# Patient Record
Sex: Male | Born: 1985 | Race: White | Hispanic: No | Marital: Married | Smoking: Former smoker
Health system: Southern US, Community
[De-identification: ages and names within clinical notes are randomized; demographics above are authoritative.]

## PROBLEM LIST (undated history)

## (undated) DIAGNOSIS — Z789 Other specified health status: Secondary | ICD-10-CM

## (undated) DIAGNOSIS — K219 Gastro-esophageal reflux disease without esophagitis: Secondary | ICD-10-CM

## (undated) HISTORY — PX: NO PAST SURGERIES: SHX2092

## (undated) HISTORY — DX: Other specified health status: Z78.9

## (undated) HISTORY — DX: Gastro-esophageal reflux disease without esophagitis: K21.9

---
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
## 2004-07-27 HISTORY — PX: WISDOM TOOTH EXTRACTION: SHX21

## 2019-01-07 ENCOUNTER — Encounter (HOSPITAL_BASED_OUTPATIENT_CLINIC_OR_DEPARTMENT_OTHER): Payer: Self-pay | Admitting: Emergency Medicine

## 2019-01-07 ENCOUNTER — Emergency Department (HOSPITAL_BASED_OUTPATIENT_CLINIC_OR_DEPARTMENT_OTHER): Payer: BC Managed Care – PPO

## 2019-01-07 ENCOUNTER — Emergency Department (HOSPITAL_BASED_OUTPATIENT_CLINIC_OR_DEPARTMENT_OTHER)
Admission: EM | Admit: 2019-01-07 | Discharge: 2019-01-07 | Disposition: A | Discharge status: Home / Self Care | Payer: BC Managed Care – PPO | Attending: Emergency Medicine | Admitting: Emergency Medicine

## 2019-01-07 ENCOUNTER — Other Ambulatory Visit: Payer: Self-pay

## 2019-01-07 DIAGNOSIS — R079 Chest pain, unspecified: Secondary | ICD-10-CM | POA: Diagnosis not present

## 2019-01-07 DIAGNOSIS — Z87891 Personal history of nicotine dependence: Secondary | ICD-10-CM | POA: Diagnosis not present

## 2019-01-07 DIAGNOSIS — R0789 Other chest pain: Secondary | ICD-10-CM | POA: Insufficient documentation

## 2019-01-07 LAB — CBC WITH DIFFERENTIAL/PLATELET
Abs Immature Granulocytes: 0.03 10*3/uL (ref 0.00–0.07)
Basophils Absolute: 0.1 10*3/uL (ref 0.0–0.1)
Basophils Relative: 1 %
Eosinophils Absolute: 0.2 10*3/uL (ref 0.0–0.5)
Eosinophils Relative: 2 %
HCT: 42.2 % (ref 39.0–52.0)
Hemoglobin: 14.6 g/dL (ref 13.0–17.0)
Immature Granulocytes: 0 %
Lymphocytes Relative: 32 %
Lymphs Abs: 3 10*3/uL (ref 0.7–4.0)
MCH: 31.9 pg (ref 26.0–34.0)
MCHC: 34.6 g/dL (ref 30.0–36.0)
MCV: 92.1 fL (ref 80.0–100.0)
Monocytes Absolute: 0.6 10*3/uL (ref 0.1–1.0)
Monocytes Relative: 6 %
Neutro Abs: 5.5 10*3/uL (ref 1.7–7.7)
Neutrophils Relative %: 59 %
Platelets: 281 10*3/uL (ref 150–400)
RBC: 4.58 MIL/uL (ref 4.22–5.81)
RDW: 12.9 % (ref 11.5–15.5)
WBC: 9.4 10*3/uL (ref 4.0–10.5)
nRBC: 0 % (ref 0.0–0.2)

## 2019-01-07 LAB — TROPONIN I
Troponin I: <0.03 ng/mL (ref <0.03)
Troponin I: <0.03 ng/mL (ref <0.03)

## 2019-01-07 LAB — BASIC METABOLIC PANEL
Anion gap: 8 (ref 5–15)
BUN: 8 mg/dL (ref 6–20)
CO2: 25 mmol/L (ref 22–32)
Calcium: 9.3 mg/dL (ref 8.9–10.3)
Chloride: 106 mmol/L (ref 98–111)
Creatinine, Ser: 0.89 mg/dL (ref 0.61–1.24)
GFR calc Af Amer: >60 mL/min (ref >60)
GFR calc non Af Amer: >60 mL/min (ref >60)
Glucose, Bld: 113 mg/dL — ABNORMAL HIGH (ref 70–99)
Potassium: 3.9 mmol/L (ref 3.5–5.1)
Sodium: 139 mmol/L (ref 135–145)

## 2019-01-07 LAB — D-DIMER, QUANTITATIVE: D-Dimer, Quant: <0.27 ug/mL-FEU (ref 0.00–0.50)

## 2019-01-07 NOTE — ED Triage Notes (Signed)
First episode of left sided CP was over 1 week ago while running. Lasted about 10 min.  Has been having a few episodes of sharp CP a day since then lasting about 1 min each.  Today it was a little worse and had pain down left arm. Denies pain at this time.

## 2019-01-07 NOTE — Discharge Instructions (Addendum)
As we discussed today, your work-up was very reassuring.  Please follow-up with referred [facility] for further evaluation.  Return the emergency department for any worsening chest pain, difficulty breathing, pain becomes more severe or frequent, fevers or any other worsening or concerning symptoms.

## 2019-01-07 NOTE — ED Provider Notes (Signed)
[facility] EMERGENCY DEPARTMENT Provider Note   CSN: [id number] Arrival date & time: 01/07/19  1252    History   Chief Complaint Chief Complaint  Patient presents with   Chest Pain    HPI Jared Simpson is a 33 y.o. male with no significant past medical history who presents for evaluation of intermittent chest pain that began approximately 1 week ago.  He first noticed the chest pain about a week ago while he was running.  He states that it was a sharp chest pain on the left side.  He states that he was already sweaty from running so he did not know if he had any associated diaphoresis but did not have any associated nausea.  He did note some mild shortness of breath but again did not know if that was because he was running.  He states that he stopped running and walked back home by the time he had walked back home, the chest pain had resolved.  He states that over the course of last week, he has had intermittent episodes of sharp chest pain.  These episodes are not associated with exertion and states that he has them both at rest and while doing work around his house.  He states that he has had it while sitting on the couch watching TV as well as well being at work.  He states that they will last about a minute or so and resolve on their own.  He states that he is not taking anything for pain.  He denies any associated nausea/vomiting/diaphoresis or shortness of breath with the pain.  He denies any pain with deep inspiration.  Patient states that today while at work at about 9 AM, he had chest pain again.  He states that this was sharp in nature on the left side and radiating to the left upper extremity.  Patient states that this lasted for about an hour which is the longest that he had ever lasted so he was concerned and came to the emergency department.  Arrival, he states his pain has resolved.  Patient denies any preceding trauma, injury, new workouts.  He states that there are no  alleviating or aggravating factors but that chest pain will just resolve on its own.  Reports he was a former smoker for about 10 years and quit about 6 months ago.  He denies any cocaine or heroin use.  He denies any personal cardiac history or family history of MIs before the age of 34.  He states that he thinks his grandmother may have some heart disease.  He denies any personal history of hypertension or diabetes. He denies any exogenous hormone use, recent immobilization, prior history of DVT/PE, recent surgery, leg swelling, or long travel.  HPI: A 33 year old patient presents for evaluation of chest pain. Initial onset of pain was approximately 1-3 hours ago. The patient's chest pain is sharp and is not worse with exertion. The patient's chest pain is middle- or left-sided, is not well-localized, is not described as heaviness/pressure/tightness and does radiate to the arms/jaw/neck. The patient does not complain of nausea and denies diaphoresis. The patient has no history of stroke, has no history of peripheral artery disease, has not smoked in the past 90 days, denies any history of treated diabetes, has no relevant family history of coronary artery disease (first degree relative at less than age 34), is not hypertensive, has no history of hypercholesterolemia and does not have an elevated BMI (>=30).   .Marland Kitchen  The history is provided by the patient.    History reviewed. No pertinent past medical history.  There are no active problems to display for this patient.   History reviewed. No pertinent surgical history.      Home Medications    Prior to Admission medications   Not on File    Family History No family history on file.  Social History Social History   Tobacco Use   Smoking status: Former Smoker   Smokeless tobacco: Never Used  Substance Use Topics   Alcohol use: Yes    Comment: 3-4 days/week   Drug use: Yes    Types: Marijuana    Comment: last used yesterday      Allergies   Penicillins   Review of Systems Review of Systems  Constitutional: Negative for fever.  Respiratory: Negative for cough and shortness of breath.   Cardiovascular: Positive for chest pain. Negative for leg swelling.  Gastrointestinal: Negative for abdominal pain, nausea and vomiting.  Genitourinary: Negative for dysuria and hematuria.  Neurological: Negative for headaches.  All other systems reviewed and are negative.    Physical Exam Updated Vital Signs BP 127/89 (BP Location: Right Arm)    Pulse 70    Temp 99.4 F (37.4 C) (Oral)    Resp 18    Ht 5\' 10"  (1.778 m)    Wt 90.7 kg    SpO2 100%    BMI 28.70 kg/m   Physical Exam Vitals signs and nursing note reviewed.  Constitutional:      Appearance: Normal appearance. He is well-developed.  HENT:     Head: Normocephalic and atraumatic.  Eyes:     General: Lids are normal.     Conjunctiva/sclera: Conjunctivae normal.     Pupils: Pupils are equal, round, and reactive to light.  Neck:     Musculoskeletal: Full passive range of motion without pain.  Cardiovascular:     Rate and Rhythm: Normal rate and regular rhythm.     Pulses: Normal pulses.          Radial pulses are 2+ on the right side and 2+ on the left side.       Dorsalis pedis pulses are 2+ on the right side and 2+ on the left side.     Heart sounds: Normal heart sounds. No murmur. No friction rub. No gallop.   Pulmonary:     Effort: Pulmonary effort is normal.     Breath sounds: Normal breath sounds.     Comments: Lungs clear to auscultation bilaterally.  Symmetric chest rise.  No wheezing, rales, rhonchi. Abdominal:     Palpations: Abdomen is soft. Abdomen is not rigid.     Tenderness: There is no abdominal tenderness. There is no guarding.  Musculoskeletal: Normal range of motion.     Comments: BLE are symmetric in appearance without any overlying warmth, erythema, edema.   Skin:    General: Skin is warm and dry.     Capillary Refill:  Capillary refill takes less than 2 seconds.  Neurological:     Mental Status: He is alert and oriented to person, place, and time.  Psychiatric:        Speech: Speech normal.      ED Treatments / Results  Labs (all labs ordered are listed, but only abnormal results are displayed) Labs Reviewed  BASIC METABOLIC PANEL - Abnormal; Notable for the following components:      Result Value   Glucose, Bld 113 (*)  All other components within normal limits  CBC WITH DIFFERENTIAL/PLATELET  TROPONIN I  D-DIMER, QUANTITATIVE (NOT [facility])  TROPONIN I    EKG EKG Interpretation  Date/Time:  Saturday January 07 2019 13:02:18 EDT Ventricular Rate:  104 PR Interval:    QRS Duration: 109 QT Interval:  350 QTC Calculation: 461 R Axis:   -14 Text Interpretation:  Sinus tachycardia Borderline prolonged PR interval No old tracing to compare Confirmed by Rolan BuccoBelfi, Melanie [phone]([id number]) on 01/07/2019 1:29:07 PM   Radiology Dg Chest 2 View  Result Date: 01/07/2019 CLINICAL DATA:  Left chest pain for 1 week. EXAM: CHEST - 2 VIEW COMPARISON:  None. FINDINGS: The heart size and mediastinal contours are within normal limits. Both lungs are clear. The visualized skeletal structures are unremarkable. IMPRESSION: No active cardiopulmonary disease. Electronically Signed   By: Gaylyn RongWalter  Liebkemann M.D.   On: 01/07/2019 13:35    Procedures Procedures (including critical care time)  Medications Ordered in ED Medications - No data to display   Initial Impression / Assessment and Plan / ED Course  I have reviewed the triage vital signs and the nursing notes.  Pertinent labs & imaging results that were available during my care of the patient were reviewed by me and considered in my medical decision making (see chart for details).     HEAR Score: 761  33 year old male with no significant past medical history who presents for evaluation of 1 week of intermittent chest pain.  Reports it is occurred randomly both at  rest and while doing things.  Resolves after about a minute or so.  Today was at work and had pain that lasted over about an hour and radiated to his left arm.  On ED arrival, pain has an on ED arrival, he is afebrile.  He is slightly tachycardic and hypertensive.  Likely secondary to anxiety.  Low suspicion for angina, ACS etiology but also consideration.  History/physical exam not concerning for pericarditis, aortic dissection.  Consider skill skeletal versus infectious etiology.  Low suspicion for PE but given patient's tachycardia not PERC out patient.  Plan for labs, chest x-ray.  CBC with no leukocytosis or anemia. BMP is unremarkable. CXR negative for any infectious etiology. Trop is negative and D-dimer is negative.   Given patient's history/risk factors, he has a heart score 1.  Given that his pain most recently occurred at 9 AM this morning, will plan for delta troponin.  Repeat troponin negative.  Discussed results with patient.  Vitals stable.  At this time, given reassuring heart score and reassuring work-up, no admission warranted.  I discussed with patient to follow-up with primary care doctor for further evaluation of his pain. At this time, patient exhibits no emergent life-threatening condition that require further evaluation in ED or admission. Patient had ample opportunity for questions and discussion. All patient's questions were answered with full understanding. Strict return precautions discussed. Patient expresses understanding and agreement to plan.   Portions of this note were generated with Scientist, clinical (histocompatibility and immunogenetics)Dragon dictation software. Dictation errors may occur despite best attempts at proofreading.  Final Clinical Impressions(s) / ED Diagnoses   Final diagnoses:  Nonspecific chest pain    ED Discharge Orders    None       Rosana HoesLayden, Santiago Graf A, PA-C 01/07/19 1952    Rolan BuccoBelfi, Melanie, MD 01/08/19 [phone]

## 2019-01-13 ENCOUNTER — Ambulatory Visit: Payer: BC Managed Care – PPO | Admitting: Family Medicine

## 2019-01-13 ENCOUNTER — Encounter: Payer: Self-pay | Admitting: Family Medicine

## 2019-01-13 ENCOUNTER — Other Ambulatory Visit: Payer: Self-pay

## 2019-01-13 VITALS — BP 128/88 | HR 98 | Temp 98.3°F | Ht 70.0 in | Wt 202.0 lb

## 2019-01-13 DIAGNOSIS — R0789 Other chest pain: Secondary | ICD-10-CM

## 2019-01-13 NOTE — Progress Notes (Signed)
Chief Complaint  Patient presents with  . New Patient (Initial Visit)    Chest pain        New Patient Visit SUBJECTIVE: HPI: Jared Simpson is an 33 y.o.male who is being seen for establishing care.  The patient has not had PCP in quite some time.   Duration of issue: 2 weeks Quality: sharp Palliation: pressing really hard Provocation: none Severity: 4/10 Radiation: L shoulder/upper arm Duration of chest pain: 5 minutes Associated symptoms: L arm pain Cardiac history: none Family heart history: none Smoker? No; former smoker 5 pack yr history Has not tried anything at home. +Hx of GERD, does not feel the same.   Allergies  Allergen Reactions  . Penicillins     Was told he is allergic but does not know what it does.    Past Medical History:  Diagnosis Date  . No known health problems    Past Surgical History:  Procedure Laterality Date  . NO PAST SURGERIES     Family History  Problem Relation Age of Onset  . Non-Hodgkin's lymphoma Brother   . Cancer Maternal Grandfather        skin cancer  . Heart disease Paternal Grandmother   . Diabetes Paternal Grandfather    Allergies  Allergen Reactions  . Penicillins     Was told he is allergic but does not know what it does.   Takes no meds routinely.   ROS Cardiovascular: Denies current chest pain  Respiratory: Denies dyspnea   OBJECTIVE: BP 128/88 (BP Location: Left Arm, Patient Position: Sitting, Cuff Size: Large)   Pulse 98   Temp 98.3 F (36.8 C) (Oral)   Ht 5\' 10"  (1.778 m)   Wt 202 lb (91.6 kg)   SpO2 97%   BMI 28.98 kg/m   Constitutional: -  VS reviewed -  Well developed, well nourished, appears stated age -  No apparent distress  Psychiatric: -  Oriented to person, place, and time -  Memory intact -  Affect and mood normal -  Fluent conversation, good eye contact -  Judgment and insight age appropriate  Eye: -  Conjunctivae clear, no discharge -  Pupils symmetric, round, reactive to light   ENMT: -  MMM    Pharynx moist, no exudate, no erythema  Neck: -  No gross swelling, no palpable masses -  Thyroid midline, not enlarged, mobile, no palpable masses  Cardiovascular: -  RRR -  No LE edema  Respiratory: -  Normal respiratory effort, no accessory muscle use, no retraction -  Breath sounds equal, no wheezes, no ronchi, no crackles  Gastrointestinal: -  Bowel sounds normal -  No tenderness, no distention, no guarding, no masses  Musculoskeletal: -   CP not reproducible to palpation -  Gait normal  Skin: -  No significant lesion on inspection -  Warm and dry to palpation   ASSESSMENT/PLAN: Atypical chest pain - Plan: EKG from ED reviewed. Very few risk factors. Offered cards referral, standing offer. Stretches/exercises for pecs given. Brought up stress could also be a cause.   Patient should return in 3 mo for CPE or prn. The patient voiced understanding and agreement to the plan.   [place], DO 01/13/19  2:28 PM

## 2019-01-13 NOTE — Patient Instructions (Addendum)
Heat (pad or rice pillow in microwave) over affected area, 10-15 minutes twice daily.   Ice/cold pack over area for 10-15 min twice daily.  Let me know if you would ever like to see a cardiologist regarding your chest pain.  Pectoralis Major Rehab Ask your health care provider which exercises are safe for you. Do exercises exactly as told by your health care provider and adjust them as directed. It is normal to feel mild stretching, pulling, tightness, or discomfort as you do these exercises, but you should stop right away if you feel sudden pain or your pain gets worse.Do not begin these exercises until told by your health care provider. Stretching and range of motion exercises These exercises warm up your muscles and joints and improve the movement and flexibility of your shoulder. These exercises can also help to relieve pain, numbness, and tingling. Exercise A: Pendulum  1. Stand near a wall or a surface that you can hold onto for balance. 2. Bend at the waist and let your left / right arm hang straight down. Use your other arm to keep your balance. 3. Relax your arm and shoulder muscles, and move your hips and your trunk so your left / right arm swings freely. Your arm should swing because of the motion of your body, not because you are using your arm or shoulder muscles. 4. Keep moving so your arm swings in the following directions, as told by your health care provider: ? Side to side. ? Forward and backward. ? In clockwise and counterclockwise circles. 5. Slowly return to the starting position. Repeat 2 times. Complete this exercise 3 times per week. Exercise B: Abduction, standing 1. Stand and hold a broomstick, a cane, or a similar object. Place your hands a little more than shoulder-width apart on the object. Your left / right hand should be palm-up, and your other hand should be palm-down. 2. While keeping your elbow straight and your shoulder muscles relaxed, push the stick across  your body toward your left / right side. Raise your left / right arm to the side of your body and then over your head until you feel a stretch in your shoulder. ? Stop when you reach the angle that is recommended by your health care provider. ? Avoid shrugging your shoulder while you raise your arm. Keep your shoulder blade tucked down toward the middle of your spine. 3. Hold for 10 seconds. 4. Slowly return to the starting position. Repeat 2 times. Complete this exercise 3 times per week. Exercise C: Wand flexion, supine  1. Lie on your back. You may bend your knees for comfort. 2. Hold a broomstick, a cane, or a similar object so that your hands are about shoulder-width apart on the object. Your palms should face toward your feet. 3. Raise your left / right arm in front of your face, then behind your head (toward the floor). Use your other hand to help you do this. Stop when you feel a gentle stretch in your shoulder, or when you reach the angle that is recommended by your health care provider. 4. Hold for 3 seconds. 5. Use the broomstick and your other arm to help you return your left / right arm to the starting position. Repeat 2 times. Complete this exercise 3 times per week. Exercise D: Wand shoulder external rotation 1. Stand and hold a broomstick, a cane, or a similar object so your handsare about shoulder-width apart on the object. 2. Start with your arms  hanging down, then bend both elbows to an "L" shape (90 degrees). 3. Keep your left / right elbow at your side. Use your other hand to push the stick so your left / right forearm moves away from your body, out to your side. ? Keep your left / right elbow bent to 90 degrees and keep it against your side. ? Stop when you feel a gentle stretch in your shoulder, or when you reach the angle recommended by your health care provider. 4. Hold for 10 seconds. 5. Use the stick to help you return your left / right arm to the starting position.  Repeat 2 times. Complete this exercise 3 times per week. Strengthening exercises These exercises build strength and endurance in your shoulder. Endurance is the ability to use your muscles for a long time, even after your muscles get tired. Exercise E: Scapular protraction, standing 1. Stand so you are facing a wall. Place your feet about one arm-length away from the wall. 2. Place your hands on the wall and straighten your elbows. 3. Keep your hands on the wall as you push your upper back away from the wall. You should feel your shoulder blades sliding forward.Keep your elbows and your head still. ? If you are not sure that you are doing this exercise correctly, ask your health care provider for more instructions. 4. Hold for 3 seconds. 5. Slowly return to the starting position. Let your muscles relax completely before you repeat this exercise. Repeat 2 times. Complete this exercise 3 times per week. Exercise F: Shoulder blade squeezes  (scapular retraction) 1. Sit with good posture in a stable chair. Do not let your back touch the back of the chair. 2. Your arms should be at your sides with your elbows bent. You may rest your forearms on a pillow if that is more comfortable. 3. Squeeze your shoulder blades together. Bring them down and back. ? Keep your shoulders level. ? Do not lift your shoulders up toward your ears. 4. Hold for 3 seconds. 5. Return to the starting position. Repeat 2 times. Complete this exercise 3 times per week. This information is not intended to replace advice given to you by your health care provider. Make sure you discuss any questions you have with your health care provider. Document Released: 07/13/2005 Document Revised: 04/23/2016 Document Reviewed: 03/31/2015 Elsevier Interactive Patient Education  [employer]2018 [employer]

## 2019-01-17 ENCOUNTER — Encounter: Payer: Self-pay | Admitting: Family Medicine

## 2019-01-17 ENCOUNTER — Telehealth: Payer: Self-pay | Admitting: Family Medicine

## 2019-01-17 NOTE — Telephone Encounter (Signed)
Letter done/patient made aware and at the front for pickup

## 2019-01-17 NOTE — Telephone Encounter (Signed)
Copied from [place] [id number]. Topic: General - Other >> Jan 17, 2019  2:24 PM Rainey Pines A wrote: Patient would like a callback in regards to a doctors note for his employer for his visit on 01-13-2019

## 2019-01-17 NOTE — Telephone Encounter (Signed)
That's fine

## 2019-02-15 ENCOUNTER — Ambulatory Visit (INDEPENDENT_AMBULATORY_CARE_PROVIDER_SITE_OTHER): Payer: BC Managed Care – PPO | Admitting: Medical

## 2019-02-15 ENCOUNTER — Encounter: Payer: Self-pay | Admitting: Medical

## 2019-02-15 DIAGNOSIS — M791 Myalgia, unspecified site: Secondary | ICD-10-CM

## 2019-02-15 DIAGNOSIS — R5383 Other fatigue: Secondary | ICD-10-CM

## 2019-02-15 DIAGNOSIS — R509 Fever, unspecified: Secondary | ICD-10-CM | POA: Diagnosis not present

## 2019-02-15 NOTE — Patient Instructions (Signed)
No current symptoms today but some of symptoms you described over past 2 days may have been  covid related. Will go ahead and get covid test as your employer requests. Work note sent to my chart. If you can't print from my chart then call and give [place] fax number. Explained return to work criteria today asked to review on note.  If signs/symptoms worsen or change let us know.  Follow up in 7 days or as needed

## 2019-02-15 NOTE — Progress Notes (Signed)
Subjective:    Patient ID: Jared Simpson, male    DOB: 12-Jan-1986, 33 y.o.   MRN: [id number]  HPI  Virtual Visit via Video Note  I connected with Jared Simpson on 02/15/19 at  3:40 PM EDT by a video enabled telemedicine application and verified that I am speaking with the correct person using two identifiers.  Location: Patient: home Provider: home  Pt did not check vitals today.   I discussed the limitations of evaluation and management by telemedicine and the availability of in person appointments. The patient expressed understanding and agreed to proceed.  History of Present Illness:   Monday evening he had fever, body aches and cold sweats. Tuesday mild bodyaches and mild fatigue. No fever yesterday. Since he woke up today feels fine. Pt told by employer he needs to have negative covid test. Pt has no close contacts with person who has covid.  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   Observations/Objective:  General- no acute distress, pleasant, oriented. Normal speech.   Assessment and Plan: No current symptoms today but some of symptoms you described over past 2 days may have been  covid related. Will go ahead and get covid test as your employer requests. Work note sent to my chart. If you can't print from my chart then call and give us fax number. Explained return to work criteria today asked to review on note.  If signs/symptoms worsen or change let us know.  Follow up in 7 days or as needed  Esperanza RichtersEdward Oscar Hank, PA-C  Follow Up Instructions:    I discussed the assessment and treatment plan with the patient. The patient was provided an opportunity to ask questions and all were answered. The patient agreed with the plan and demonstrated an understanding of the instructions.   The patient was advised to call back or seek an in-person evaluation if the symptoms worsen or if the condition fails to improve as anticipated.  I provided 15  minutes of non-face-to-face time during this encounter.   Esperanza RichtersEdward Kareli Hossain, PA-C   Review of Systems  Constitutional: Negative for chills, diaphoresis, fatigue and fever.  HENT: Negative for congestion, facial swelling, sinus pressure, sinus pain and trouble swallowing.   Respiratory: Negative for cough, chest tightness, shortness of breath and wheezing.   Cardiovascular: Negative for chest pain and palpitations.  Gastrointestinal: Negative for abdominal pain and diarrhea.  Genitourinary: Negative for dysuria.  Musculoskeletal: Negative for back pain and myalgias.  Skin: Negative for rash.  Neurological: Negative for dizziness, weakness and light-headedness.  Psychiatric/Behavioral: Negative for behavioral problems and confusion.    Past Medical History:  Diagnosis Date  . No known health problems      Social History   Socioeconomic History  . Marital status: Single    Spouse name: Not on file  . Number of children: Not on file  . Years of education: Not on file  . Highest education level: Not on file  Occupational History  . Not on file  Social Needs  . Financial resource strain: Not on file  . Food insecurity    Worry: Not on file    Inability: Not on file  . Transportation needs    Medical: Not on file    Non-medical: Not on file  Tobacco Use  . Smoking status: Former Games developermoker  . Smokeless tobacco: Never Used  Substance and Sexual Activity  . Alcohol use: Yes    Comment: 3-4 days/week  .  Drug use: Yes    Types: Marijuana    Comment: last used yesterday  . Sexual activity: Not on file  Lifestyle  . Physical activity    Days per week: Not on file    Minutes per session: Not on file  . Stress: Not on file  Relationships  . Social Herbalist on phone: Not on file    Gets together: Not on file    Attends religious service: Not on file    Active member of club or organization: Not on file    Attends meetings of clubs or organizations: Not on file     Relationship status: Not on file  . Intimate partner violence    Fear of current or ex partner: Not on file    Emotionally abused: Not on file    Physically abused: Not on file    Forced sexual activity: Not on file  Other Topics Concern  . Not on file  Social History Narrative  . Not on file    Past Surgical History:  Procedure Laterality Date  . NO PAST SURGERIES      Family History  Problem Relation Age of Onset  . Non-Hodgkin's lymphoma Brother   . Cancer Maternal Grandfather        skin cancer  . Heart disease Paternal Grandmother   . Diabetes Paternal Grandfather     Allergies  Allergen Reactions  . Penicillins     Was told he is allergic but does not know what it does.    No current outpatient medications on file prior to visit.   No current facility-administered medications on file prior to visit.     There were no vitals taken for this visit.      Objective:   Physical Exam        Assessment & Plan:

## 2019-02-16 ENCOUNTER — Other Ambulatory Visit: Payer: Self-pay

## 2019-02-16 DIAGNOSIS — Z20822 Contact with and (suspected) exposure to covid-19: Secondary | ICD-10-CM

## 2019-02-19 LAB — NOVEL CORONAVIRUS, NAA: SARS-CoV-2, NAA: NOT DETECTED

## 2019-02-21 ENCOUNTER — Telehealth: Payer: Self-pay | Admitting: General Practice

## 2019-02-21 NOTE — Telephone Encounter (Signed)
Pt given negative covid results and has understanding

## 2019-03-18 DIAGNOSIS — Z20828 Contact with and (suspected) exposure to other viral communicable diseases: Secondary | ICD-10-CM | POA: Diagnosis not present

## 2019-04-18 ENCOUNTER — Encounter: Payer: BC Managed Care – PPO | Admitting: Family Medicine

## 2020-04-14 IMAGING — DX CHEST - 2 VIEW
2 series · 2 of 2 positions shown · non-contrast
Comparison: None.

CLINICAL DATA: Left chest pain for 1 week.

EXAM:
CHEST - 2 VIEW

[chest pa]
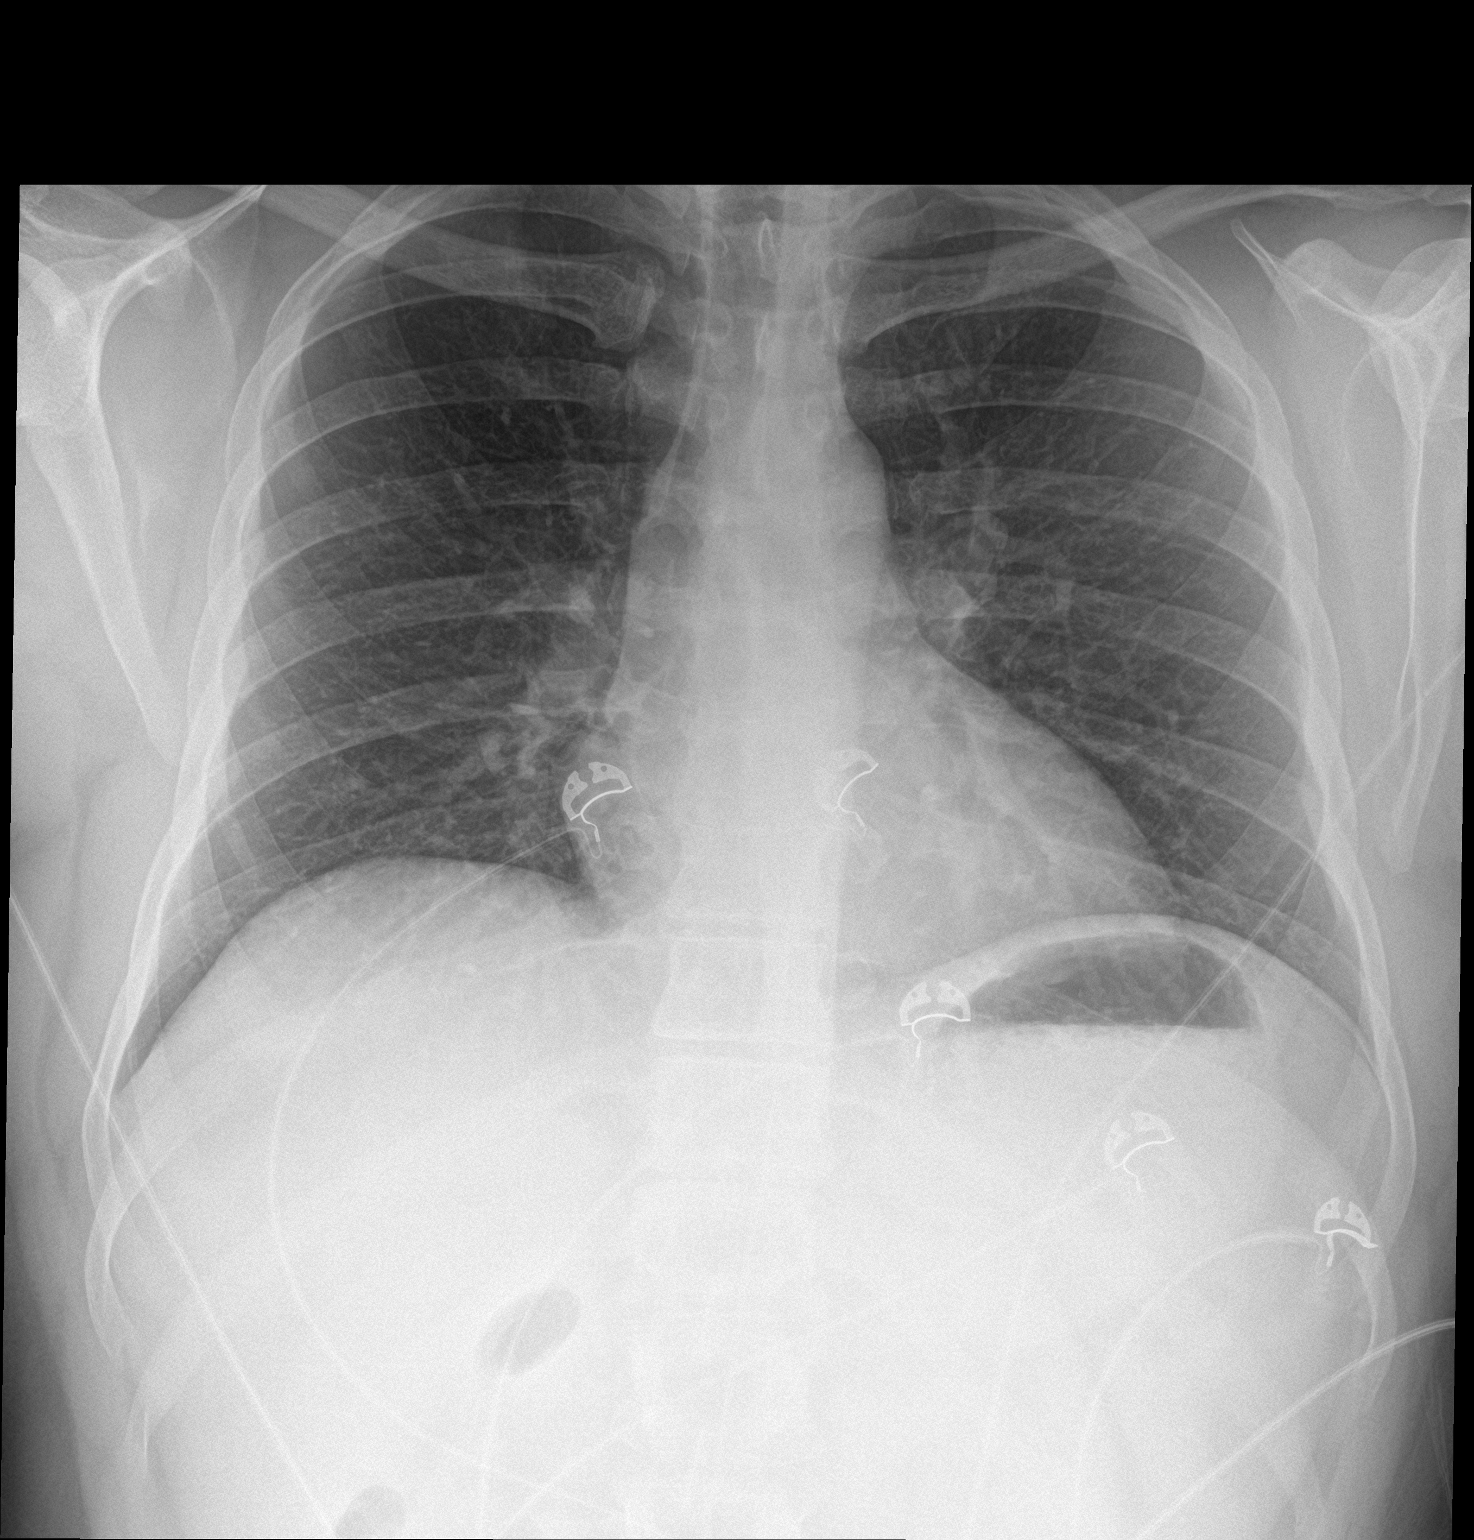

[chest lat]
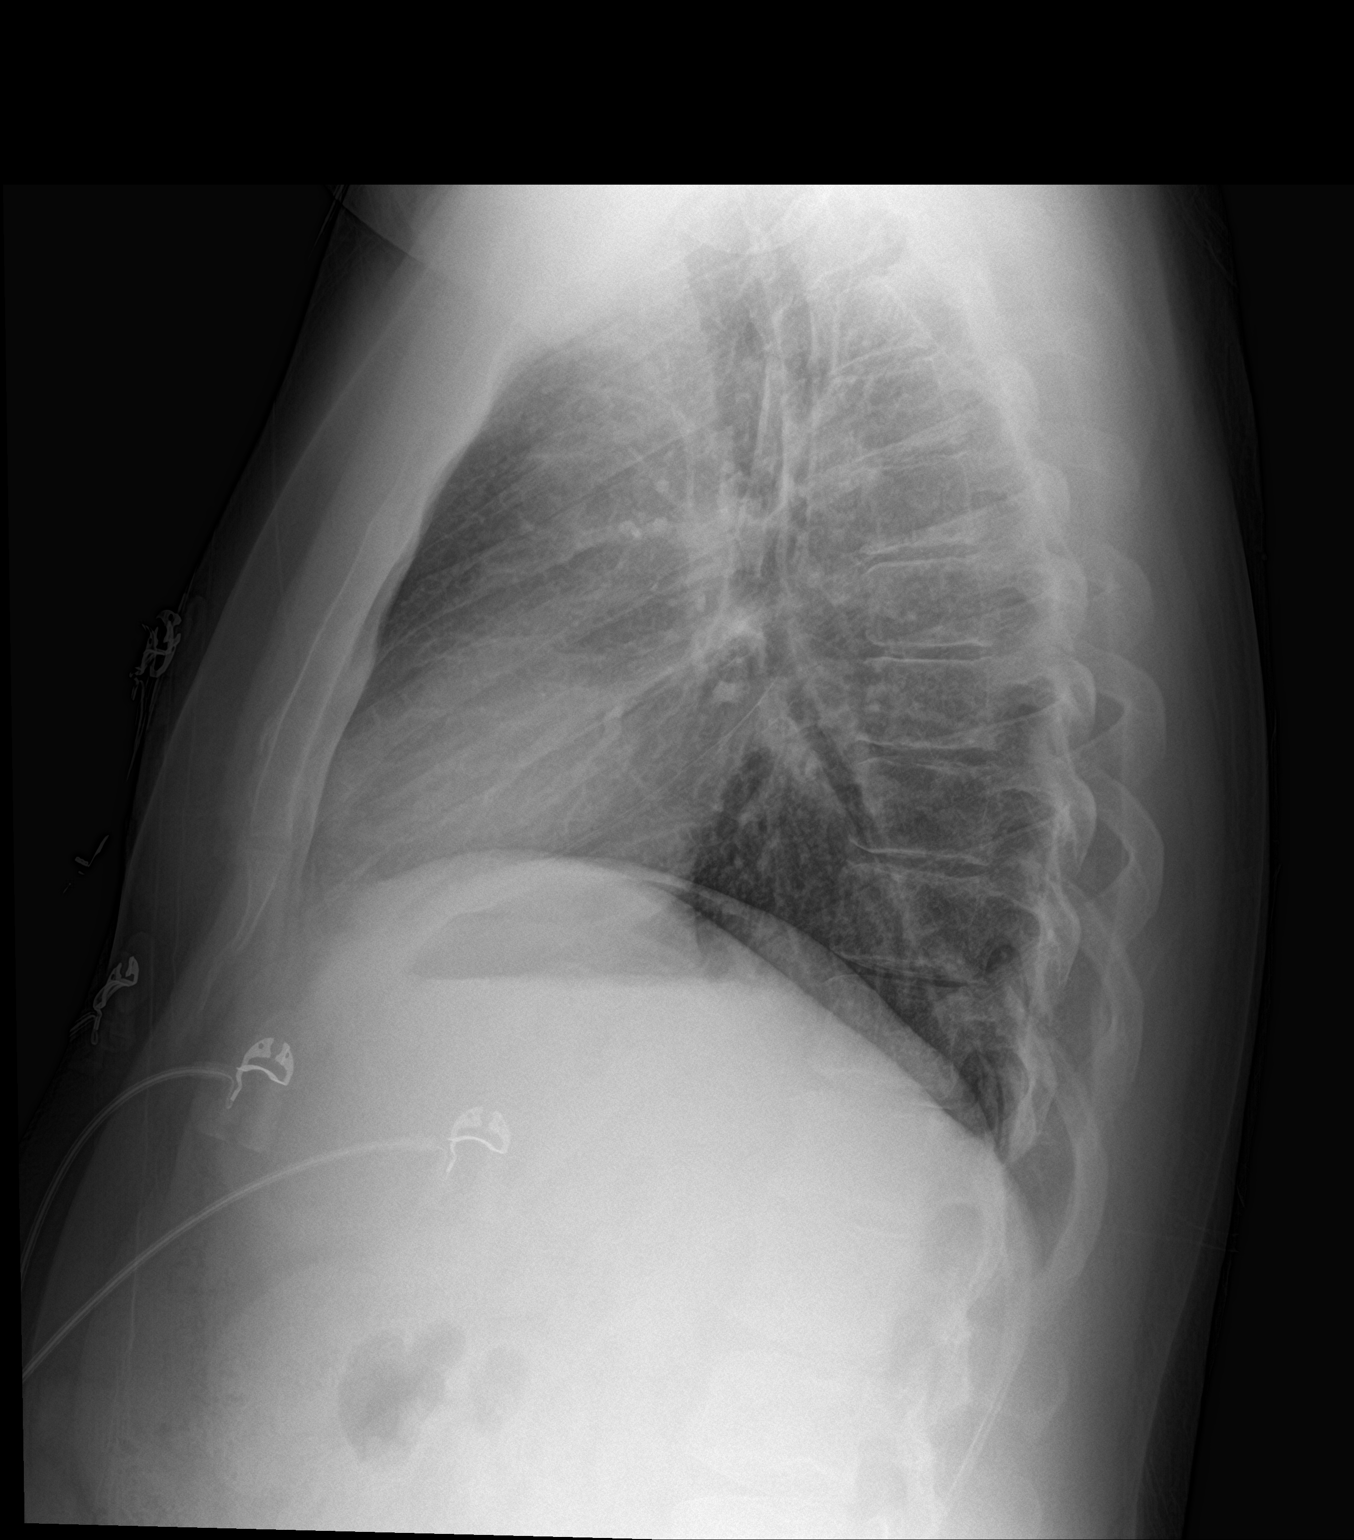

[2 of 2 positions shown; findings below may reference images not displayed]

FINDINGS: The heart size and mediastinal contours are within normal limits.
Both lungs are clear. The visualized skeletal structures are
unremarkable.
IMPRESSION: No active cardiopulmonary disease.

## 2022-07-08 ENCOUNTER — Ambulatory Visit: Payer: BC Managed Care – PPO | Admitting: Family Medicine

## 2022-07-08 ENCOUNTER — Encounter: Payer: Self-pay | Admitting: Family Medicine

## 2022-07-08 VITALS — BP 120/82 | HR 76 | Temp 98.0°F | Ht 69.0 in | Wt 221.4 lb

## 2022-07-08 DIAGNOSIS — K649 Unspecified hemorrhoids: Secondary | ICD-10-CM

## 2022-07-08 MED ORDER — NITROGLYCERIN 0.4 % RE OINT: TOPICAL_OINTMENT | RECTAL | 0 refills | Status: DC

## 2022-07-08 MED ORDER — HYDROCORTISONE 2.5 % EX CREA: TOPICAL_CREAM | Freq: Two times a day (BID) | CUTANEOUS | 0 refills | Status: DC

## 2022-07-08 NOTE — Patient Instructions (Addendum)
Stay hydrated.   Don't fill the cream if it is too expensive (nitro). The hydrocortisone should be cheap.   If you do not hear anything about your referral in the next 1-2 weeks, call our office and ask for an update.  Foods that may reduce pain: 1) Ginger 2) Blueberries 3) Salmon 4) Pumpkin seeds 5) dark chocolate 6) turmeric 7) tart cherries 8) virgin olive oil 9) chilli peppers 10) mint 11) krill oil  Let us know if you need anything.

## 2022-07-08 NOTE — Progress Notes (Signed)
Chief Complaint  Patient presents with   New Patient (Initial Visit)    Using all the OTC meds for the Hemorrhoid   Hemorrhoids    Subjective: Patient is a 36 y.o. male here for a painful hemorrhoid.  Going on for 3 mo. +bleeding, slight itching. Throbbing/burning pain more bothersome. Drinks >100 oz of water daily. Tried Prep H wipes/ointments, Epsom soaks, fiber supplements. BM's are fine, no straining or hard stool. Feels that it is getting worse. Painful when he sits. Denies trauma, wt lifting, obvious trigger. He has a hx of mild hemorrhoids, but this is much more severe.   Past Medical History:  Diagnosis Date   No known health problems    Objective: BP 120/82 (BP Location: Left Arm, Patient Position: Sitting, Cuff Size: Normal)   Pulse 76   Temp 98 F (36.7 C) (Oral)   Ht 5\' 9"  (1.753 m)   Wt 221 lb 6 oz (100.4 kg)   SpO2 98%   BMI 32.69 kg/m  General: Awake, appears stated age Heart: RRR, no LE edema Lungs: CTAB, no rales, wheezes or rhonchi. No accessory muscle use Abd: BS+, S, NT, ND Rectal: No external lesions, fissures or hemorrhoids noted, no signs of bleeding Psych: Age appropriate judgment and insight, normal affect and mood  Assessment and Plan: Hemorrhoids, unspecified hemorrhoid type - Plan: hydrocortisone 2.5 % cream, Nitroglycerin 0.4 % OINT, Ambulatory referral to Gastroenterology  Chronic, uncontrolled. Stay hydrated. BM's have been normal and without straining. Probably has a hemorrhoid just distal to dentate line. Will send above cream/ointment. If no improvement, will have him see GI. Doubt they can get him in within the next couple weeks so will place referral today. F/u in 1 mo for CPE or prn.  The patient voiced understanding and agreement to the plan.  [place], DO 07/08/22  9:26 AM

## 2022-07-22 DIAGNOSIS — F411 Generalized anxiety disorder: Secondary | ICD-10-CM | POA: Diagnosis not present

## 2022-07-29 ENCOUNTER — Encounter: Payer: Self-pay | Admitting: Gastroenterology

## 2022-07-30 DIAGNOSIS — F411 Generalized anxiety disorder: Secondary | ICD-10-CM | POA: Diagnosis not present

## 2022-08-06 DIAGNOSIS — F411 Generalized anxiety disorder: Secondary | ICD-10-CM | POA: Diagnosis not present

## 2022-08-07 ENCOUNTER — Telehealth: Payer: Self-pay | Admitting: Family Medicine

## 2022-08-07 NOTE — Telephone Encounter (Signed)
Patient called to advise that he has been summoned for jury duty on 08/11/2022. Since he is unable to sit for long periods, patient said he needs a doctor's note to show to excuse him from jury duty explaining the treatment. Patient does not see specialist until end of the month. Patient is okay with letter being placed in mychart.

## 2022-08-07 NOTE — Telephone Encounter (Signed)
Please advise , patient also does have an appointment 08/10/22

## 2022-08-10 ENCOUNTER — Encounter: Payer: Self-pay | Admitting: Family Medicine

## 2022-08-10 ENCOUNTER — Ambulatory Visit (INDEPENDENT_AMBULATORY_CARE_PROVIDER_SITE_OTHER): Payer: BC Managed Care – PPO | Admitting: Family Medicine

## 2022-08-10 ENCOUNTER — Other Ambulatory Visit: Payer: Self-pay | Admitting: Family Medicine

## 2022-08-10 VITALS — BP 122/84 | HR 91 | Temp 98.1°F | Ht 69.0 in | Wt 222.2 lb

## 2022-08-10 DIAGNOSIS — Z0000 Encounter for general adult medical examination without abnormal findings: Secondary | ICD-10-CM

## 2022-08-10 DIAGNOSIS — Z23 Encounter for immunization: Secondary | ICD-10-CM

## 2022-08-10 DIAGNOSIS — E785 Hyperlipidemia, unspecified: Secondary | ICD-10-CM

## 2022-08-10 LAB — COMPREHENSIVE METABOLIC PANEL
ALT: 21 U/L (ref 0–53)
AST: 16 U/L (ref 0–37)
Albumin: 4.7 g/dL (ref 3.5–5.2)
Alkaline Phosphatase: 60 U/L (ref 39–117)
BUN: 10 mg/dL (ref 6–23)
CO2: 25 mEq/L (ref 19–32)
Calcium: 9.6 mg/dL (ref 8.4–10.5)
Chloride: 105 mEq/L (ref 96–112)
Creatinine, Ser: 0.99 mg/dL (ref 0.40–1.50)
GFR: 97.54 mL/min (ref >60.00)
Glucose, Bld: 92 mg/dL (ref 70–99)
Potassium: 4.6 mEq/L (ref 3.5–5.1)
Sodium: 139 mEq/L (ref 135–145)
Total Bilirubin: 0.4 mg/dL (ref 0.2–1.2)
Total Protein: 7.3 g/dL (ref 6.0–8.3)

## 2022-08-10 LAB — CBC
HCT: 45.3 % (ref 39.0–52.0)
Hemoglobin: 15.7 g/dL (ref 13.0–17.0)
MCHC: 34.6 g/dL (ref 30.0–36.0)
MCV: 92.1 fl (ref 78.0–100.0)
Platelets: 301 10*3/uL (ref 150.0–400.0)
RBC: 4.91 Mil/uL (ref 4.22–5.81)
RDW: 12.8 % (ref 11.5–15.5)
WBC: 8.3 10*3/uL (ref 4.0–10.5)

## 2022-08-10 LAB — LIPID PANEL
Cholesterol: 158 mg/dL (ref 0–200)
HDL: 31.1 mg/dL — ABNORMAL LOW (ref >39.00)
NonHDL: 127.14
Total CHOL/HDL Ratio: 5
Triglycerides: 365 mg/dL — ABNORMAL HIGH (ref 0.0–149.0)
VLDL: 73 mg/dL — ABNORMAL HIGH (ref 0.0–40.0)

## 2022-08-10 LAB — URINALYSIS, MICROSCOPIC ONLY: RBC / HPF: NONE SEEN (ref 0–?)

## 2022-08-10 LAB — LDL CHOLESTEROL, DIRECT: Direct LDL: 65 mg/dL

## 2022-08-10 NOTE — Progress Notes (Signed)
Chief Complaint  Patient presents with   Annual Exam    hemorrhoid    Well Male Jared Simpson is here for a complete physical.   His last physical was >1 year ago.  Current diet: in general, diet is fair.   Current exercise: walking, lifting wts Weight trend: stable Fatigue out of ordinary? No. Seat belt? Yes.   Advanced directive? No  Health maintenance Tetanus- Due HIV- Yes Hep C- Yes  Past Medical History:  Diagnosis Date   No known health problems      Past Surgical History:  Procedure Laterality Date   NO PAST SURGERIES      Medications  Current Outpatient Medications on File Prior to Visit  Medication Sig Dispense Refill   hydrocortisone 2.5 % cream Apply topically 2 (two) times daily. 30 g 0   Allergies Allergies  Allergen Reactions   Penicillins     Was told he is allergic but does not know what it does.    Family History Family History  Problem Relation Age of Onset   Non-Hodgkin's lymphoma Brother    Cancer Maternal Grandfather        skin cancer   Heart disease Paternal Grandmother    Diabetes Paternal Grandfather     Review of Systems: Constitutional: no fevers or chills Eye:  no recent significant change in vision Ear/Nose/Mouth/Throat:  Ears:  no hearing loss Nose/Mouth/Throat:  no complaints of nasal congestion, no sore throat Cardiovascular:  no chest pain Respiratory:  no shortness of breath Gastrointestinal:  no abdominal pain, no change in bowel habits GU:  Male: negative for dysuria, +freq Musculoskeletal/Extremities:  no pain of the joints Integumentary (Skin/Breast):  no abnormal skin lesions reported Neurologic:  no headaches Endocrine: No unexpected weight changes Hematologic/Lymphatic:  no night sweats  Exam BP 122/84 (BP Location: Left Arm, Patient Position: Sitting, Cuff Size: Normal)   Pulse 91   Temp 98.1 F (36.7 C) (Oral)   Ht 5\' 9"  (1.753 m)   Wt 222 lb 4 oz (100.8 kg)   SpO2 98%   BMI 32.82 kg/m  General:   well developed, well nourished, in no apparent distress Skin:  no significant moles, warts, or growths Head:  no masses, lesions, or tenderness Eyes:  pupils equal and round, sclera anicteric without injection Ears:  canals without lesions, TMs shiny without retraction, no obvious effusion, no erythema Nose:  nares patent, mucosa normal Throat/Pharynx:  lips and gingiva without lesion; tongue and uvula midline; non-inflamed pharynx; no exudates or postnasal drainage Neck: neck supple without adenopathy, thyromegaly, or masses Lungs:  clear to auscultation, breath sounds equal bilaterally, no respiratory distress Cardio:  regular rate and rhythm, no bruits, no LE edema Abdomen:  abdomen soft, nontender; bowel sounds normal; no masses or organomegaly Genital (male): Deferred Rectal: Deferred Musculoskeletal:  symmetrical muscle groups noted without atrophy or deformity Extremities:  no clubbing, cyanosis, or edema, no deformities, no skin discoloration Neuro:  gait normal; deep tendon reflexes normal and symmetric Psych: well oriented with normal range of affect and appropriate judgment/insight  Assessment and Plan  Well adult exam - Plan: CBC, Comprehensive metabolic panel, Lipid panel, Urine Microscopic Only   Well 37 y.o. male. Counseled on diet and exercise. Self testicular exams recommended at least monthly.  Tdap today. Suggested getting the covid vaccine.  Urinary freq. Ck micro today, if he desires med, he will let me know. Does not want as of today.  Other orders as above. Advanced directive form provided today.  Follow up in 1 year pending the above workup. The patient voiced understanding and agreement to the plan.  [place], DO 08/10/22 7:55 AM

## 2022-08-10 NOTE — Addendum Note (Signed)
Addended by: Sharon Seller B on: 08/10/2022 08:06 AM   Modules accepted: Orders

## 2022-08-10 NOTE — Telephone Encounter (Signed)
Patient being seen today in the office 08/11/2022--will discuss with PCP. Note closed.

## 2022-08-10 NOTE — Patient Instructions (Addendum)
Give [place] 2-3 business days to get the results of your labs back.   Keep the diet clean and stay active.  Do monthly self testicular checks in the shower. You are feeling for lumps/bumps that don't belong. If you feel anything like this, let me know!  OK to use Debrox (peroxide) in the ear to loosen up wax. Also recommend using a bulb syringe (for removing boogers from baby's noses) to flush through warm water and vinegar (3-4:1 ratio). An alternative, though more expensive, is an elephant ear washer wax removal kit. Do not use Q-tips as this can impact wax further.  Please get me a copy of your advanced directive form at your convenience.   Let us know if you need anything.

## 2022-08-13 DIAGNOSIS — F411 Generalized anxiety disorder: Secondary | ICD-10-CM | POA: Diagnosis not present

## 2022-08-25 ENCOUNTER — Ambulatory Visit: Payer: BC Managed Care – PPO | Admitting: Gastroenterology

## 2022-08-25 ENCOUNTER — Encounter: Payer: Self-pay | Admitting: Gastroenterology

## 2022-08-25 VITALS — BP 120/82 | HR 110 | Ht 69.0 in | Wt 224.2 lb

## 2022-08-25 DIAGNOSIS — K611 Rectal abscess: Secondary | ICD-10-CM

## 2022-08-25 DIAGNOSIS — K640 First degree hemorrhoids: Secondary | ICD-10-CM

## 2022-08-25 DIAGNOSIS — K6289 Other specified diseases of anus and rectum: Secondary | ICD-10-CM

## 2022-08-25 DIAGNOSIS — K5900 Constipation, unspecified: Secondary | ICD-10-CM

## 2022-08-25 DIAGNOSIS — K921 Melena: Secondary | ICD-10-CM

## 2022-08-25 MED ORDER — CIPROFLOXACIN HCL 500 MG PO TABS: 500.0000 mg | ORAL_TABLET | Freq: Two times a day (BID) | ORAL | 0 refills | Status: DC

## 2022-08-25 MED ORDER — METRONIDAZOLE 500 MG PO TABS: 500.0000 mg | ORAL_TABLET | Freq: Two times a day (BID) | ORAL | 0 refills | Status: DC

## 2022-08-25 NOTE — Patient Instructions (Addendum)
We have sent the following medications to your pharmacy for you to pick up at your convenience:   Ciprofloxacin 500 mg Two times daily for 5 days and   Flagyl 500 mg Two times daily for 5 days.   Call us in one week with an update.  _______________________________________________________  If your blood pressure at your visit was 140/90 or greater, please contact your primary care physician to follow up on this.  _______________________________________________________  If you are age 38 or younger, your body mass index should be between 19-25. Your Body mass index is 33.12 kg/m. If this is out of the aformentioned range listed, please consider follow up with your Primary Care Provider.   __________________________________________________________  The [facility] providers would like to encourage you to use [facility] to communicate with providers for non-urgent requests or questions.  Due to long hold times on the telephone, sending your provider a message by [facility] may be a faster and more efficient way to get a response.  Please allow 48 business hours for a response.  Please remember that this is for non-urgent requests.    Due to recent changes in healthcare laws, you may see the results of your imaging and laboratory studies on MyChart before your provider has had a chance to review them.  We understand that in some cases there may be results that are confusing or concerning to you. Not all laboratory results come back in the same time frame and the provider may be waiting for multiple results in order to interpret others.  Please give [place] 48 hours in order for your provider to thoroughly review all the results before contacting the office for clarification of your results.     Thank you for choosing me and [facility].  Vito Cirigliano, D.O.

## 2022-08-25 NOTE — Progress Notes (Signed)
Chief Complaint: Hematochezia, rectal pain   Referring Provider:     Shelda Pal, DO   HPI:     Jared Simpson is a 37 y.o. male with no known chronic medical issues, referred to the Gastroenterology Clinic for evaluation of hematochezia and rectal pain.  Has had weight for mild hemorrhoidal symptoms in the past described as intermittent scant BRBPR rectal itching, but approximately 4 months ago developed worsening perirectal symptoms described as throbbing, burning type pain and increased BRBPR.  Pain worse with sitting, driving.  +dyschezia. Has had episodes where he feels a "pop" and symptoms improved for a few days, then symptoms recur.  No constipation, straining to have BM, etc.    No improvement with trial of Preparation H wipes, topical Preparation H, Epsom salt soaks.   Was seen by his PCM for this issue on 07/08/2022.  Exam was unremarkable and was treated with hydrocortisone 2.5% and prescribed topical NTG 0.4% (not covered by insurance, so never filled) and referred to the GI clinic.  Otherwise, no nausea, vomiting, change in bowel habits, fever, weight loss.  No previous colonoscopy.   No known family history of CRC, GI malignancy, liver disease, pancreatic disease, or IBD.        Latest Ref Rng & Units 08/10/2022    7:58 AM 01/07/2019    1:10 PM  CBC  WBC 4.0 - 10.5 K/uL 8.3  9.4   Hemoglobin 13.0 - 17.0 g/dL 15.7  14.6   Hematocrit 39.0 - 52.0 % 45.3  42.2   Platelets 150.0 - 400.0 K/uL 301.0  281       Latest Ref Rng & Units 08/10/2022    7:58 AM 01/07/2019    1:10 PM  CMP  Glucose 70 - 99 mg/dL 92  113   BUN 6 - 23 mg/dL 10  8   Creatinine 0.40 - 1.50 mg/dL 0.99  0.89   Sodium 135 - 145 mEq/L 139  139   Potassium 3.5 - 5.1 mEq/L 4.6  3.9   Chloride 96 - 112 mEq/L 105  106   CO2 19 - 32 mEq/L 25  25   Calcium 8.4 - 10.5 mg/dL 9.6  9.3   Total Protein 6.0 - 8.3 g/dL 7.3    Total Bilirubin 0.2 - 1.2 mg/dL 0.4    Alkaline Phos 39 -  117 U/L 60    AST 0 - 37 U/L 16    ALT 0 - 53 U/L 21       Past Medical History:  Diagnosis Date   No known health problems      Past Surgical History:  Procedure Laterality Date   NO PAST SURGERIES     Family History  Problem Relation Age of Onset   Non-Hodgkin's lymphoma Brother    Cancer Maternal Grandfather        skin cancer   Heart disease Paternal Grandmother    Diabetes Paternal Grandfather    Social History   Tobacco Use   Smoking status: Former    Packs/day: 1.00    Years: 13.00    Total pack years: 13.00    Types: Cigarettes    Quit date: 2018    Years since quitting: 6.0   Smokeless tobacco: Never  Vaping Use   Vaping Use: Never used  Substance Use Topics   Alcohol use: Yes    Comment: 3-4 days/week   Drug use: Not Currently  Current Outpatient Medications  Medication Sig Dispense Refill   hydrocortisone 2.5 % cream Apply topically 2 (two) times daily. 30 g 0   No current facility-administered medications for this visit.   Allergies  Allergen Reactions   Penicillins     Was told he is allergic but does not know what it does.     Review of Systems: All systems reviewed and negative except where noted in HPI.     Physical Exam:    Wt Readings from Last 3 Encounters:  08/25/22 224 lb 4 oz (101.7 kg)  08/10/22 222 lb 4 oz (100.8 kg)  07/08/22 221 lb 6 oz (100.4 kg)    BP 120/82   Pulse (!) 110   Ht 5\' 9"  (1.753 m)   Wt 224 lb 4 oz (101.7 kg)   BMI 33.12 kg/m  Constitutional:  Pleasant, in no acute distress. Psychiatric: Normal mood and affect. Behavior is normal. Cardiovascular: Normal rate, regular rhythm. No edema Pulmonary/chest: Effort normal and breath sounds normal. No wheezing, rales or rhonchi. Abdominal: Soft, nondistended, nontender. Bowel sounds active throughout. There are no masses palpable. No hepatomegaly. Neurological: Alert and oriented to person place and time. Skin: Skin is warm and dry. No rashes  noted. Rectal exam: Sensation intact and preserved anal wink.  Small area of redundant tissue/skin tag in the 9 o'clock position when in left lateral recumbent.  This area had TTP and what appeared to be a small fistulous tract without any drainage.  No discernible fluctuance.  Otherwise small grade 1-2 internal hemorrhoids.   (Chaperone: Renee Rival, CMA).    ASSESSMENT AND PLAN;   1) Perianal abscess 2) Hematochezia 3) Dyschezia 4) Perirectal pain Exam with what appears to be a small, perianal fistulous type tract.  Unclear if this is truly a fistula or drainage from a previous abscess.  Discussed full DDx with the patient at length today with the following plan:  - As the area appears to be deroofed, do not think he needs urgent referral to surgery for I&D at the moment - Has PCN allergy. Will tx wth Cipro/Flagyl x 5 days -Tentative plan for colonoscopy depending on response to therapy to evaluate for IBD.  He will update me later this week with response to therapy - If no improvement, will plan for colonoscopy, surgical referral, +/- MRI Pelvis  5) Internal hemorrhoids While he may have had prior hemorrhoidal type symptoms, more recent symptoms are less hemorrhoidal in nature.  Can hold hydrocortisone topical for the time being    Lavena Bullion, DO, FACG  08/25/2022, 2:02 PM   [place], [place]*

## 2022-08-27 DIAGNOSIS — F411 Generalized anxiety disorder: Secondary | ICD-10-CM | POA: Diagnosis not present

## 2022-09-01 ENCOUNTER — Telehealth: Payer: Self-pay | Admitting: Gastroenterology

## 2022-09-01 NOTE — Telephone Encounter (Signed)
Inbound call from patient stating that he was seen on 1/30 and was advised to follow up with our office in a week after taking his antibiotics. Patient stated that he is a little better, but still has some pain and discomfort. Patient is requesting a call back to discuss. Please advise.

## 2022-09-01 NOTE — Telephone Encounter (Signed)
Spoke with pt. Pt reports he finished antibiotics on Sunday and still has the same symptoms he had before but pain is less intense. Pt also has pain while sitting.

## 2022-09-02 ENCOUNTER — Other Ambulatory Visit: Payer: Self-pay

## 2022-09-02 DIAGNOSIS — K6289 Other specified diseases of anus and rectum: Secondary | ICD-10-CM

## 2022-09-02 DIAGNOSIS — K5900 Constipation, unspecified: Secondary | ICD-10-CM

## 2022-09-02 DIAGNOSIS — K611 Rectal abscess: Secondary | ICD-10-CM

## 2022-09-02 DIAGNOSIS — K921 Melena: Secondary | ICD-10-CM

## 2022-09-02 NOTE — Telephone Encounter (Signed)
Order placed for MRI and message sent to radiology scheduling. Referral faxed to [facility]. Called pt to let him know recommendations and provided pt with number to radiology scheduling if he does not receive a call from radiology scheduling.

## 2022-09-03 DIAGNOSIS — F411 Generalized anxiety disorder: Secondary | ICD-10-CM | POA: Diagnosis not present

## 2022-09-11 ENCOUNTER — Ambulatory Visit (HOSPITAL_COMMUNITY)
Admission: RE | Admit: 2022-09-11 | Discharge: 2022-09-11 | Disposition: A | Discharge status: Home / Self Care | Payer: BC Managed Care – PPO | Source: Ambulatory Visit | Attending: Gastroenterology | Admitting: Gastroenterology

## 2022-09-11 DIAGNOSIS — K6289 Other specified diseases of anus and rectum: Secondary | ICD-10-CM | POA: Diagnosis not present

## 2022-09-11 DIAGNOSIS — K921 Melena: Secondary | ICD-10-CM | POA: Diagnosis not present

## 2022-09-11 DIAGNOSIS — K5900 Constipation, unspecified: Secondary | ICD-10-CM | POA: Diagnosis not present

## 2022-09-11 DIAGNOSIS — Z0389 Encounter for observation for other suspected diseases and conditions ruled out: Secondary | ICD-10-CM | POA: Diagnosis not present

## 2022-09-11 DIAGNOSIS — K6389 Other specified diseases of intestine: Secondary | ICD-10-CM | POA: Diagnosis not present

## 2022-09-11 DIAGNOSIS — K611 Rectal abscess: Secondary | ICD-10-CM | POA: Diagnosis not present

## 2022-09-11 MED ORDER — GADOBUTROL 1 MMOL/ML IV SOLN
10.0000 mL | Freq: Once | INTRAVENOUS | Status: AC | PRN
  Administered 2022-09-11: 10 mL via INTRAVENOUS

## 2022-09-15 ENCOUNTER — Telehealth: Payer: Self-pay

## 2022-09-15 ENCOUNTER — Other Ambulatory Visit: Payer: Self-pay

## 2022-09-15 ENCOUNTER — Other Ambulatory Visit (INDEPENDENT_AMBULATORY_CARE_PROVIDER_SITE_OTHER): Payer: BC Managed Care – PPO

## 2022-09-15 DIAGNOSIS — E785 Hyperlipidemia, unspecified: Secondary | ICD-10-CM | POA: Diagnosis not present

## 2022-09-15 DIAGNOSIS — K5900 Constipation, unspecified: Secondary | ICD-10-CM

## 2022-09-15 DIAGNOSIS — K921 Melena: Secondary | ICD-10-CM

## 2022-09-15 DIAGNOSIS — K6289 Other specified diseases of anus and rectum: Secondary | ICD-10-CM

## 2022-09-15 LAB — LDL CHOLESTEROL, DIRECT: Direct LDL: 53 mg/dL

## 2022-09-15 LAB — LIPID PANEL
Cholesterol: 174 mg/dL (ref 0–200)
HDL: 33.3 mg/dL — ABNORMAL LOW (ref >39.00)
Total CHOL/HDL Ratio: 5
Triglycerides: 497 mg/dL — ABNORMAL HIGH (ref 0.0–149.0)

## 2022-09-15 MED ORDER — NA SULFATE-K SULFATE-MG SULF 17.5-3.13-1.6 GM/177ML PO SOLN: 1.0000 | Freq: Once | ORAL | 0 refills | Status: AC

## 2022-09-16 DIAGNOSIS — K601 Chronic anal fissure: Secondary | ICD-10-CM | POA: Diagnosis not present

## 2022-09-17 DIAGNOSIS — F411 Generalized anxiety disorder: Secondary | ICD-10-CM | POA: Diagnosis not present

## 2022-09-24 DIAGNOSIS — F411 Generalized anxiety disorder: Secondary | ICD-10-CM | POA: Diagnosis not present

## 2022-10-01 DIAGNOSIS — F411 Generalized anxiety disorder: Secondary | ICD-10-CM | POA: Diagnosis not present

## 2022-10-08 DIAGNOSIS — F411 Generalized anxiety disorder: Secondary | ICD-10-CM | POA: Diagnosis not present

## 2022-10-11 ENCOUNTER — Encounter: Payer: Self-pay | Admitting: Certified Registered Nurse Anesthetist

## 2022-10-12 ENCOUNTER — Ambulatory Visit (AMBULATORY_SURGERY_CENTER): Payer: BC Managed Care – PPO | Admitting: Gastroenterology

## 2022-10-12 ENCOUNTER — Encounter: Payer: Self-pay | Admitting: Gastroenterology

## 2022-10-12 VITALS — BP 109/73 | HR 81 | Temp 98.2°F | Resp 13 | Ht 69.0 in | Wt 224.0 lb

## 2022-10-12 DIAGNOSIS — K921 Melena: Secondary | ICD-10-CM

## 2022-10-12 DIAGNOSIS — K602 Anal fissure, unspecified: Secondary | ICD-10-CM

## 2022-10-12 DIAGNOSIS — K640 First degree hemorrhoids: Secondary | ICD-10-CM

## 2022-10-12 DIAGNOSIS — K5900 Constipation, unspecified: Secondary | ICD-10-CM

## 2022-10-12 DIAGNOSIS — K6289 Other specified diseases of anus and rectum: Secondary | ICD-10-CM | POA: Diagnosis not present

## 2022-10-12 MED ORDER — SODIUM CHLORIDE 0.9 % IV SOLN: 500.0000 mL | Freq: Once | INTRAVENOUS | Status: DC

## 2022-10-12 NOTE — Progress Notes (Signed)
Report given to PACU, vss 

## 2022-10-12 NOTE — Progress Notes (Signed)
Called to room to assist during endoscopic procedure.  Patient ID and intended procedure confirmed with present staff. Received instructions for my participation in the procedure from the performing physician.  

## 2022-10-12 NOTE — Op Note (Signed)
[facility] Patient Name: Jared Simpson Procedure Date: 10/12/2022 10:18 AM MRN: [id number] Endoscopist: Gerrit Heck , MD, [id number] Age: 37 Referring MD:  Date of Birth: Dec 30, 1985 Gender: Male Account #: [id number] Procedure:                Colonoscopy Indications:              Hematochezia, Rectal pain Medicines:                Monitored Anesthesia Care Procedure:                Pre-Anesthesia Assessment:                           - Prior to the procedure, a History and Physical                            was performed, and patient medications and                            allergies were reviewed. The patient's tolerance of                            previous anesthesia was also reviewed. The risks                            and benefits of the procedure and the sedation                            options and risks were discussed with the patient.                            All questions were answered, and informed consent                            was obtained. Prior Anticoagulants: The patient has                            taken no anticoagulant or antiplatelet agents. ASA                            Grade Assessment: I - A normal, healthy patient.                            After reviewing the risks and benefits, the patient                            was deemed in satisfactory condition to undergo the                            procedure.                           After obtaining informed consent, the colonoscope  was passed under direct vision. Throughout the                            procedure, the patient's blood pressure, pulse, and                            oxygen saturations were monitored continuously. The                            CF [id number] was introduced through the anus                            and advanced to the the terminal ileum. The                            colonoscopy was performed without difficulty. The                             patient tolerated the procedure well. The quality                            of the bowel preparation was good. The terminal                            ileum, ileocecal valve, appendiceal orifice, and                            rectum were photographed. Scope In: 10:27:48 AM Scope Out: 10:43:27 AM Scope Withdrawal Time: 0 hours 11 minutes 57 seconds  Total Procedure Duration: 0 hours 15 minutes 39 seconds  Findings:                 The perianal and digital rectal examinations were                            normal.                           Localized mild inflammation characterized by                            congestion (edema) and aphthous ulcerations was                            found in the distal rectum, limited to the distal                            2-3 cm. Biopsies were taken with a cold forceps for                            histology. Estimated blood loss was minimal.                           Non-bleeding internal hemorrhoids were found during  retroflexion. The hemorrhoids were small.                           The remainder of the colon was normal.                           The terminal ileum appeared normal. Complications:            No immediate complications. Estimated Blood Loss:     Estimated blood loss was minimal. Impression:               - Localized mild inflammation was found in the                            distal rectum. Biopsied.                           - Non-bleeding internal hemorrhoids.                           - The entire examined colon is normal.                           - The examined portion of the ileum was normal.                           - The GI Genius (intelligent endoscopy module),                            computer-aided polyp detection system powered by AI                            was utilized to detect colorectal polyps through                            enhanced visualization  during colonoscopy. Recommendation:           - Patient has a contact number available for                            emergencies. The signs and symptoms of potential                            delayed complications were discussed with the                            patient. Return to normal activities tomorrow.                            Written discharge instructions were provided to the                            patient.                           - Resume previous diet.                           -  Continue present medications.                           - Await pathology results.                           - Repeat colonoscopy at age 43 for routine colon                            cancer screening purposes, or potentially sooner                            depending on pathology results. Gerrit Heck, MD 10/12/2022 10:51:25 AM

## 2022-10-12 NOTE — Patient Instructions (Addendum)
Handouts on hemorrhoids given to patient.  Biopsies taken today, await for pathology results. Resume previous diet and continue present medications. Repeat colonoscopy at age 37 for screening purposes!   YOU HAD AN ENDOSCOPIC PROCEDURE TODAY AT [facility]:   Refer to the procedure report that was given to you for any specific questions about what was found during the examination.  If the procedure report does not answer your questions, please call your gastroenterologist to clarify.  If you requested that your care partner not be given the details of your procedure findings, then the procedure report has been included in a sealed envelope for you to review at your convenience later.  YOU SHOULD EXPECT: Some feelings of bloating in the abdomen. Passage of more gas than usual.  Walking can help get rid of the air that was put into your GI tract during the procedure and reduce the bloating. If you had a lower endoscopy (such as a colonoscopy or flexible sigmoidoscopy) you may notice spotting of blood in your stool or on the toilet paper. If you underwent a bowel prep for your procedure, you may not have a normal bowel movement for a few days.  Please Note:  You might notice some irritation and congestion in your nose or some drainage.  This is from the oxygen used during your procedure.  There is no need for concern and it should clear up in a day or so.  SYMPTOMS TO REPORT IMMEDIATELY:  Following lower endoscopy (colonoscopy or flexible sigmoidoscopy):  Excessive amounts of blood in the stool  Significant tenderness or worsening of abdominal pains  Swelling of the abdomen that is new, acute  Fever of 100F or higher   For urgent or emergent issues, a gastroenterologist can be reached at any hour by calling [phone]. Do not use MyChart messaging for urgent concerns.    DIET:  We do recommend a small meal at first, but then you may proceed to your regular diet.  Drink  plenty of fluids but you should avoid alcoholic beverages for 24 hours.  ACTIVITY:  You should plan to take it easy for the rest of today and you should NOT DRIVE or use heavy machinery until tomorrow (because of the sedation medicines used during the test).    FOLLOW UP: Our staff will call the number listed on your records the next business day following your procedure.  We will call around 7:15- 8:00 am to check on you and address any questions or concerns that you may have regarding the information given to you following your procedure. If we do not reach you, we will leave a message.     If any biopsies were taken you will be contacted by phone or by letter within the next 1-3 weeks.  Please call us at [phone] if you have not heard about the biopsies in 3 weeks.    SIGNATURES/CONFIDENTIALITY: You and/or your care partner have signed paperwork which will be entered into your electronic medical record.  These signatures attest to the fact that that the information above on your After Visit Summary has been reviewed and is understood.  Full responsibility of the confidentiality of this discharge information lies with you and/or your care-partner.

## 2022-10-12 NOTE — Progress Notes (Signed)
GASTROENTEROLOGY PROCEDURE H&P NOTE   Primary Care Physician: Shelda Pal, DO    Reason for Procedure:   Hematochezia, dyschezia  Plan:    Colonoscopy  Patient is appropriate for endoscopic procedure(s) in the ambulatory ([facility]) setting.  The nature of the procedure, as well as the risks, benefits, and alternatives were carefully and thoroughly reviewed with the patient. Ample time for discussion and questions allowed. The patient understood, was satisfied, and agreed to proceed.     HPI: Jared Simpson is a 37 y.o. male who presents for colonoscopy for evaluation of hematochezia, dyschezia, anal fissure, and r/o IBD.   Past Medical History:  Diagnosis Date   GERD (gastroesophageal reflux disease)    No known health problems     Past Surgical History:  Procedure Laterality Date   gum graft  1997   NO PAST SURGERIES     WISDOM TOOTH EXTRACTION Bilateral 2006    Prior to Admission medications   Medication Sig Start Date End Date Taking? Authorizing Provider  nifedipine 0.3 % ointment Place 1 Application rectally 4 (four) times daily. 09/16/22  Yes [provider]    Current Outpatient Medications  Medication Sig Dispense Refill   nifedipine 0.3 % ointment Place 1 Application rectally 4 (four) times daily.     Current Facility-Administered Medications  Medication Dose Route Frequency Provider Last Rate Last Admin   0.9 %  sodium chloride infusion  500 mL Intravenous Once Sheryl Towell V, DO        Allergies as of 10/12/2022 - Review Complete 10/12/2022  Allergen Reaction Noted   Penicillins  01/07/2019    Family History  Problem Relation Age of Onset   Non-Hodgkin's lymphoma Brother    Cancer Maternal Grandfather        skin cancer   Heart disease Paternal Grandmother    Diabetes Paternal Grandfather    Colon cancer Neg Hx    Esophageal cancer Neg Hx    Rectal cancer Neg Hx    Stomach cancer Neg Hx     Social History    Socioeconomic History   Marital status: Married    Spouse name: Not on file   Number of children: Not on file   Years of education: Not on file   Highest education level: Not on file  Occupational History   Not on file  Tobacco Use   Smoking status: Former    Packs/day: 1.00    Years: 13.00    Additional pack years: 0.00    Total pack years: 13.00    Types: Cigarettes    Quit date: 2018    Years since quitting: 6.2   Smokeless tobacco: Never  Vaping Use   Vaping Use: Some days   Substances: CBD  Substance and Sexual Activity   Alcohol use: Not Currently   Drug use: Not Currently   Sexual activity: Yes    Partners: Female  Other Topics Concern   Not on file  Social History Narrative   Not on file   Social Determinants of Health   Financial Resource Strain: Not on file  Food Insecurity: Not on file  Transportation Needs: Not on file  Physical Activity: Not on file  Stress: Not on file  Social Connections: Not on file  Intimate Partner Violence: Not on file    Physical Exam: Vital signs in last 24 hours: @BP  (!) 130/92   Pulse 71   Temp 98.2 F (36.8 C)   Resp 15   Ht  5\' 9"  (1.753 m)   Wt 224 lb (101.6 kg)   SpO2 97%   BMI 33.08 kg/m  GEN: NAD EYE: Sclerae anicteric ENT: MMM CV: Non-tachycardic Pulm: CTA b/l GI: Soft, NT/ND NEURO:  Alert & Oriented x 3   Gerrit Heck, DO [facility]   10/12/2022 10:20 AM

## 2022-10-13 ENCOUNTER — Telehealth: Payer: Self-pay | Admitting: *Deleted

## 2022-10-13 NOTE — Telephone Encounter (Signed)
Attempted to call patient for their post-procedure follow-up call. No answer. Left voicemail.   

## 2022-10-15 DIAGNOSIS — F411 Generalized anxiety disorder: Secondary | ICD-10-CM | POA: Diagnosis not present

## 2022-10-20 DIAGNOSIS — F411 Generalized anxiety disorder: Secondary | ICD-10-CM | POA: Diagnosis not present

## 2022-11-03 DIAGNOSIS — F411 Generalized anxiety disorder: Secondary | ICD-10-CM | POA: Diagnosis not present

## 2022-11-17 DIAGNOSIS — F411 Generalized anxiety disorder: Secondary | ICD-10-CM | POA: Diagnosis not present

## 2022-11-19 ENCOUNTER — Other Ambulatory Visit: Payer: Self-pay

## 2022-11-19 MED ORDER — MESALAMINE 1000 MG RE SUPP: 1000.0000 mg | Freq: Every day | RECTAL | 1 refills | Status: DC

## 2022-12-15 DIAGNOSIS — K601 Chronic anal fissure: Secondary | ICD-10-CM | POA: Diagnosis not present

## 2022-12-15 DIAGNOSIS — F411 Generalized anxiety disorder: Secondary | ICD-10-CM | POA: Diagnosis not present

## 2022-12-18 ENCOUNTER — Ambulatory Visit: Payer: BC Managed Care – PPO | Admitting: Gastroenterology

## 2022-12-18 ENCOUNTER — Encounter: Payer: Self-pay | Admitting: Gastroenterology

## 2022-12-18 VITALS — BP 120/74 | HR 82 | Ht 69.0 in | Wt 219.0 lb

## 2022-12-18 DIAGNOSIS — K602 Anal fissure, unspecified: Secondary | ICD-10-CM

## 2022-12-18 DIAGNOSIS — K51219 Ulcerative (chronic) proctitis with unspecified complications: Secondary | ICD-10-CM | POA: Diagnosis not present

## 2022-12-18 MED ORDER — MESALAMINE 1000 MG RE SUPP: 1000.0000 mg | Freq: Every day | RECTAL | 3 refills | Status: DC

## 2022-12-18 NOTE — Patient Instructions (Addendum)
We have sent the following medications to your pharmacy for you to pick up at your convenience: Canasa Suppoitiry  Please follow up in 6 months. Give [place] a call at [phone] to schedule an appointment.   Due to recent changes in healthcare laws, you may see the results of your imaging and laboratory studies on MyChart before your provider has had a chance to review them.  We understand that in some cases there may be results that are confusing or concerning to you. Not all laboratory results come back in the same time frame and the provider may be waiting for multiple results in order to interpret others.  Please give [place] 48 hours in order for your provider to thoroughly review all the results before contacting the office for clarification of your results.    Thank you for choosing me and [facility].  Vito Cirigliano, D.O.

## 2022-12-18 NOTE — Progress Notes (Unsigned)
   Chief Complaint:    Anal fissure, ulcerative proctitis   HPI:     Patient is a 37 y.o. male presenting to the Gastroenterology Clinic for follow-up.   - 08/25/2022: Initial appointment in GI clinic for intermittent BRBPR.  Exam with possible small fistula vs fissure vs draining abscess.  Treated with Cipro/Flagyl x 5 days - 09/11/2022: MRI pelvis: Normal - 09/16/2022: Evaluated by Dr. Cliffton Asters in Colorectal Surgery.  Exam with small external hemorrhoids, midline anal fissure.  Treated with topical nifedipine. - 10/12/2022: Colonoscopy: Localized inflammation in distal rectum (path: Active, chronic nonspecific colitis), Internal hemorrhoids.  Otherwise normal colon and ileum.  - Started Canasa supp with excellent clinical response. - 12/15/2022: Follow-up in the Colorectal Surgery clinic with Dr. Cliffton Asters.  Symptoms improving with topical nifedipine and Canasa suppository  Today, he states his symptoms are much improved.   Review of systems:     No chest pain, no SOB, no fevers, no urinary sx   Past Medical History:  Diagnosis Date   GERD (gastroesophageal reflux disease)    No known health problems     Patient's surgical history, family medical history, social history, medications and allergies were all reviewed in Epic    Current Outpatient Medications  Medication Sig Dispense Refill   mesalamine (CANASA) 1000 MG suppository Place 1 suppository (1,000 mg total) rectally at bedtime. 30 suppository 1   nifedipine 0.3 % ointment Place 1 Application rectally 4 (four) times daily.     No current facility-administered medications for this visit.    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  Has had an excellent response to Canasa suppositories.  Remainder of the colon and ileum were normal. - Discussed pathophysiology of UC/proctitis today - Provided with Rx for continued Canasa 1 gm qhs supp, #90, RF 3 - If symptoms well-controlled at follow-up, may consider trialing off therapy vs repeat flexible sigmoidoscopy to assess for deep remission  2) Anal fissure - Resume topical nifedipine - Continue appropriate toilet hygiene as discussed with Colorectal Surgeon    RTC in 6 months or sooner prn          Shellia Cleverly ,DO, FACG 12/18/2022, 10:23 AM

## 2023-01-12 DIAGNOSIS — F411 Generalized anxiety disorder: Secondary | ICD-10-CM | POA: Diagnosis not present

## 2023-02-09 DIAGNOSIS — F411 Generalized anxiety disorder: Secondary | ICD-10-CM | POA: Diagnosis not present

## 2023-02-18 DIAGNOSIS — F411 Generalized anxiety disorder: Secondary | ICD-10-CM | POA: Diagnosis not present

## 2023-02-25 ENCOUNTER — Ambulatory Visit: Payer: BC Managed Care – PPO | Admitting: Family Medicine

## 2023-02-25 VITALS — BP 128/76 | HR 105 | Temp 99.0°F | Ht 69.0 in | Wt 229.2 lb

## 2023-02-25 DIAGNOSIS — L989 Disorder of the skin and subcutaneous tissue, unspecified: Secondary | ICD-10-CM | POA: Diagnosis not present

## 2023-02-25 NOTE — Progress Notes (Signed)
Chief Complaint  Patient presents with   Referral    For Dermatology for a rash he has had since childhood     Jared Simpson is a 37 y.o. male here for a skin complaint.  Duration:  since he was a child; worse over the past 6 months Location: lower abd region Pruritic? No Painful? Yes- sometimes when it gets irritated Drainage? No- will bleed New soaps/lotions/topicals/detergents? No Sick contacts? No Other associated symptoms: starting to bleed Therapies tried thus far: Bacitracin  Past Medical History:  Diagnosis Date   GERD (gastroesophageal reflux disease)    No known health problems     BP 128/76 (BP Location: Left Arm, Patient Position: Sitting, Cuff Size: Normal)   Pulse (!) 105   Temp 99 F (37.2 C) (Oral)   Ht 5\' 9"  (1.753 m)   Wt 229 lb 4 oz (104 kg)   SpO2 97%   BMI 33.85 kg/m  Gen: awake, alert, appearing stated age Lungs: No accessory muscle use Skin: See below. No drainage, erythema, TTP, fluctuance, excoriation Psych: Age appropriate judgment and insight    Skin lesion - Plan: Ambulatory referral to Dermatology  Somewhat friable lesion on abd region. No immediate tx today. TAO over excoriated lesions prn. Refer to derm. If he cannot get in within a reasonable time frame, will consider biopsy in our office.  F/u prn. The patient voiced understanding and agreement to the plan.  Jilda Roche [place], DO 02/25/23 2:02 PM

## 2023-02-25 NOTE — Patient Instructions (Signed)
Use Bacitracin or triple antibiotic ointment over open lesions on your stomach.   If you do not hear anything about your referral in the next 1-2 weeks, call our office and ask for an update.  Let us know if you need anything.

## 2023-03-04 DIAGNOSIS — Q898 Other specified congenital malformations: Secondary | ICD-10-CM | POA: Diagnosis not present

## 2023-03-15 DIAGNOSIS — Q279 Congenital malformation of peripheral vascular system, unspecified: Secondary | ICD-10-CM | POA: Diagnosis not present

## 2023-03-16 DIAGNOSIS — F411 Generalized anxiety disorder: Secondary | ICD-10-CM | POA: Diagnosis not present

## 2023-03-21 ENCOUNTER — Other Ambulatory Visit: Payer: Self-pay | Admitting: Gastroenterology

## 2023-05-04 DIAGNOSIS — F411 Generalized anxiety disorder: Secondary | ICD-10-CM | POA: Diagnosis not present

## 2023-06-04 ENCOUNTER — Encounter: Payer: Self-pay | Admitting: Gastroenterology

## 2023-06-29 DIAGNOSIS — F411 Generalized anxiety disorder: Secondary | ICD-10-CM | POA: Diagnosis not present

## 2023-08-03 DIAGNOSIS — Z880 Allergy status to penicillin: Secondary | ICD-10-CM | POA: Diagnosis not present

## 2023-08-03 DIAGNOSIS — K219 Gastro-esophageal reflux disease without esophagitis: Secondary | ICD-10-CM | POA: Diagnosis not present

## 2023-08-03 DIAGNOSIS — D181 Lymphangioma, any site: Secondary | ICD-10-CM | POA: Diagnosis not present

## 2023-08-03 DIAGNOSIS — Q278 Other specified congenital malformations of peripheral vascular system: Secondary | ICD-10-CM | POA: Diagnosis not present

## 2023-08-03 DIAGNOSIS — K5190 Ulcerative colitis, unspecified, without complications: Secondary | ICD-10-CM | POA: Diagnosis not present

## 2023-08-03 DIAGNOSIS — Q279 Congenital malformation of peripheral vascular system, unspecified: Secondary | ICD-10-CM | POA: Diagnosis not present

## 2023-08-13 ENCOUNTER — Ambulatory Visit (INDEPENDENT_AMBULATORY_CARE_PROVIDER_SITE_OTHER): Payer: BC Managed Care – PPO | Admitting: Family Medicine

## 2023-08-13 ENCOUNTER — Encounter: Payer: Self-pay | Admitting: Family Medicine

## 2023-08-13 VITALS — BP 128/76 | HR 82 | Temp 98.0°F | Resp 16 | Ht 69.0 in | Wt 216.4 lb

## 2023-08-13 DIAGNOSIS — Z23 Encounter for immunization: Secondary | ICD-10-CM | POA: Diagnosis not present

## 2023-08-13 DIAGNOSIS — Z0000 Encounter for general adult medical examination without abnormal findings: Secondary | ICD-10-CM | POA: Diagnosis not present

## 2023-08-13 LAB — LIPID PANEL
Cholesterol: 201 mg/dL — ABNORMAL HIGH (ref 0–200)
HDL: 35.6 mg/dL — ABNORMAL LOW (ref >39.00)
NonHDL: 165.51
Total CHOL/HDL Ratio: 6
Triglycerides: 417 mg/dL — ABNORMAL HIGH (ref 0.0–149.0)
VLDL: 83.4 mg/dL — ABNORMAL HIGH (ref 0.0–40.0)

## 2023-08-13 LAB — COMPREHENSIVE METABOLIC PANEL
ALT: 31 U/L (ref 0–53)
AST: 20 U/L (ref 0–37)
Albumin: 4.9 g/dL (ref 3.5–5.2)
Alkaline Phosphatase: 73 U/L (ref 39–117)
BUN: 12 mg/dL (ref 6–23)
CO2: 27 meq/L (ref 19–32)
Calcium: 9.7 mg/dL (ref 8.4–10.5)
Chloride: 101 meq/L (ref 96–112)
Creatinine, Ser: 0.95 mg/dL (ref 0.40–1.50)
GFR: 101.77 mL/min (ref >60.00)
Glucose, Bld: 90 mg/dL (ref 70–99)
Potassium: 4.2 meq/L (ref 3.5–5.1)
Sodium: 140 meq/L (ref 135–145)
Total Bilirubin: 0.6 mg/dL (ref 0.2–1.2)
Total Protein: 7.7 g/dL (ref 6.0–8.3)

## 2023-08-13 LAB — CBC
HCT: 44.7 % (ref 39.0–52.0)
Hemoglobin: 15.2 g/dL (ref 13.0–17.0)
MCHC: 33.9 g/dL (ref 30.0–36.0)
MCV: 93.4 fL (ref 78.0–100.0)
Platelets: 310 10*3/uL (ref 150.0–400.0)
RBC: 4.78 Mil/uL (ref 4.22–5.81)
RDW: 13.2 % (ref 11.5–15.5)
WBC: 9.1 10*3/uL (ref 4.0–10.5)

## 2023-08-13 LAB — LDL CHOLESTEROL, DIRECT: Direct LDL: 84 mg/dL

## 2023-08-13 NOTE — Addendum Note (Signed)
Addended by: Kathi Ludwig on: 08/13/2023 07:38 AM   Modules accepted: Orders

## 2023-08-13 NOTE — Patient Instructions (Addendum)
Give [place] 2-3 business days to get the results of your labs back.   Keep the diet clean and stay active.  Please get me a copy of your advanced directive form at your convenience.   Do monthly self testicular checks in the shower. You are feeling for lumps/bumps that don't belong. If you feel anything like this, let me know!  Send me a message if you want to see a urologist to discuss a vasectomy.   Let us know if you need anything.

## 2023-08-13 NOTE — Progress Notes (Signed)
Chief Complaint  Patient presents with   Annual Exam    Annual Exam    Well Male Jared Simpson is here for a complete physical.   His last physical was >1 year ago.  Current diet: in general, diet could be better.   Current exercise: active with kids Weight trend: has lost wt since recent surgery Fatigue out of ordinary? No. Seat belt? Yes.   Advanced directive? No  Health maintenance Tetanus- Yes HIV- Yes Hep C- Yes  Past Medical History:  Diagnosis Date   GERD (gastroesophageal reflux disease)    No known health problems      Past Surgical History:  Procedure Laterality Date   gum graft  1997   NO PAST SURGERIES     WISDOM TOOTH EXTRACTION Bilateral 2006    Medications  Takes no meds routinely.    Allergies Allergies  Allergen Reactions   Penicillins     Was told he is allergic but does not know what it does.    Family History Family History  Problem Relation Age of Onset   Non-Hodgkin's lymphoma Brother    Cancer Maternal Grandfather        skin cancer   Heart disease Paternal Grandmother    Diabetes Paternal Grandfather    Colon cancer Neg Hx    Esophageal cancer Neg Hx    Rectal cancer Neg Hx    Stomach cancer Neg Hx     Review of Systems: Constitutional: no fevers or chills Eye:  no recent significant change in vision Ear/Nose/Mouth/Throat:  Ears:  no hearing loss Nose/Mouth/Throat:  no complaints of nasal congestion, no sore throat Cardiovascular:  no chest pain Respiratory:  no shortness of breath Gastrointestinal:  no abdominal pain, no change in bowel habits GU:  Male: negative for dysuria Musculoskeletal/Extremities:  no pain of the joints Integumentary (Skin/Breast):  no abnormal skin lesions reported Neurologic:  no headaches Endocrine: No unexpected weight changes Hematologic/Lymphatic:  no night sweats  Exam Pulse 82   Temp 98 F (36.7 C) (Oral)   Resp 16   Ht 5\' 9"  (1.753 m)   Wt 216 lb 6.4 oz (98.2 kg)   SpO2 98%   BMI  31.96 kg/m  General:  well developed, well nourished, in no apparent distress Skin:  no significant moles, warts, or growths Head:  no masses, lesions, or tenderness Eyes:  pupils equal and round, sclera anicteric without injection Ears:  canals without lesions, TMs shiny without retraction, no obvious effusion, no erythema Nose:  nares patent, mucosa normal Throat/Pharynx:  lips and gingiva without lesion; tongue and uvula midline; non-inflamed pharynx; no exudates or postnasal drainage Neck: neck supple without adenopathy, thyromegaly, or masses Lungs:  clear to auscultation, breath sounds equal bilaterally, no respiratory distress Cardio:  regular rate and rhythm, no bruits, no LE edema Abdomen:  abdomen soft, nontender; bowel sounds normal; no masses or organomegaly Genital (male): Deferred Rectal: Deferred Musculoskeletal:  symmetrical muscle groups noted without atrophy or deformity Extremities:  no clubbing, cyanosis, or edema, no deformities, no skin discoloration Neuro:  gait normal; deep tendon reflexes normal and symmetric Psych: well oriented with normal range of affect and appropriate judgment/insight  Assessment and Plan  Well adult exam - Plan: CBC, Comprehensive metabolic panel, Lipid panel   Well 38 y.o. male. Counseled on diet and exercise. Self testicular exams recommended at least monthly.  Advanced directive form provided today.  Flu shot today. Other orders as above. Follow up in 1 year pending the  above workup. The patient voiced understanding and agreement to the plan.  Jilda Roche [place], DO 08/13/23 6:59 AM

## 2023-08-14 ENCOUNTER — Encounter: Payer: Self-pay | Admitting: Family Medicine

## 2023-08-14 DIAGNOSIS — E782 Mixed hyperlipidemia: Secondary | ICD-10-CM

## 2023-08-20 ENCOUNTER — Other Ambulatory Visit: Payer: Self-pay | Admitting: Family Medicine

## 2023-08-20 MED ORDER — ROSUVASTATIN CALCIUM 20 MG PO TABS: 20.0000 mg | ORAL_TABLET | Freq: Every day | ORAL | 3 refills | Status: DC

## 2023-08-24 DIAGNOSIS — F411 Generalized anxiety disorder: Secondary | ICD-10-CM | POA: Diagnosis not present

## 2023-09-21 DIAGNOSIS — F411 Generalized anxiety disorder: Secondary | ICD-10-CM | POA: Diagnosis not present

## 2023-12-13 ENCOUNTER — Encounter: Payer: Self-pay | Admitting: Family Medicine

## 2023-12-14 DIAGNOSIS — F411 Generalized anxiety disorder: Secondary | ICD-10-CM | POA: Diagnosis not present

## 2023-12-17 ENCOUNTER — Encounter: Payer: Self-pay | Admitting: Family Medicine

## 2023-12-17 ENCOUNTER — Ambulatory Visit: Payer: Self-pay | Admitting: Family Medicine

## 2023-12-17 ENCOUNTER — Ambulatory Visit: Admitting: Family Medicine

## 2023-12-17 VITALS — BP 132/78 | HR 99 | Temp 98.0°F | Resp 16 | Ht 69.0 in | Wt 207.0 lb

## 2023-12-17 DIAGNOSIS — F418 Other specified anxiety disorders: Secondary | ICD-10-CM | POA: Diagnosis not present

## 2023-12-17 DIAGNOSIS — E785 Hyperlipidemia, unspecified: Secondary | ICD-10-CM

## 2023-12-17 LAB — LIPID PANEL
Cholesterol: 112 mg/dL (ref 0–200)
HDL: 35.3 mg/dL — ABNORMAL LOW (ref >39.00)
LDL Cholesterol: 47 mg/dL (ref 0–99)
NonHDL: 76.2
Total CHOL/HDL Ratio: 3
Triglycerides: 148 mg/dL (ref 0.0–149.0)
VLDL: 29.6 mg/dL (ref 0.0–40.0)

## 2023-12-17 LAB — HEPATIC FUNCTION PANEL
ALT: 37 U/L (ref 0–53)
AST: 22 U/L (ref 0–37)
Albumin: 5 g/dL (ref 3.5–5.2)
Alkaline Phosphatase: 71 U/L (ref 39–117)
Bilirubin, Direct: 0.1 mg/dL (ref 0.0–0.3)
Total Bilirubin: 0.5 mg/dL (ref 0.2–1.2)
Total Protein: 7.4 g/dL (ref 6.0–8.3)

## 2023-12-17 NOTE — Progress Notes (Signed)
 Chief Complaint  Patient presents with   Referral    Discuss Referral    Subjective Jared Simpson is an 38 y.o. male who presents with anxiety/depression Symptoms began 4-6 mo ago. Anxiety symptoms: chest pain, difficulty concentrating, fatigue, feelings of losing control, irritable, psychomotor agitation, racing thoughts, shortness of breath. Depressive symptoms: none Family history significant for no psychiatric illness. Social stressors include: sleep deprived with 66 mo old child. He is currently being treated with nothing and has failed Abilify, SSRI's, lithium. Did best w Wellbutrin, Xanax, Klonopin He is not following with a psychologist.  Past Medical History:  Diagnosis Date   GERD (gastroesophageal reflux disease)      Family History Family History  Problem Relation Age of Onset   Non-Hodgkin's lymphoma Brother    Cancer Maternal Grandfather        skin cancer   Heart disease Paternal Grandmother    Diabetes Paternal Grandfather    Colon cancer Neg Hx    Esophageal cancer Neg Hx    Rectal cancer Neg Hx    Stomach cancer Neg Hx     Exam BP 132/78 (BP Location: Left Arm, Patient Position: Sitting)   Pulse 99   Temp 98 F (36.7 C) (Oral)   Resp 16   Ht 5\' 9"  (1.753 m)   Wt 207 lb (93.9 kg)   SpO2 96%   BMI 30.57 kg/m  General:  well developed, well nourished, in no apparent distress Lungs:  normal respiratory effort without accessory muscle use Psych: flat affect, age-appropriate judgement/insight  Assessment and Plan  Situational anxiety - Plan: Ambulatory referral to Psychology  Hyperlipidemia, unspecified hyperlipidemia type - Plan: Hepatic function panel, Lipid panel  Refer to b health placed. Counseled on adjunctive treatment with exercise/physical activity. Number for counseling offices provided in AVS. Anxiety coping techniques placed in AVS. Offered meds but pt declined politely. Will reach out if he wishes to try Wellbutrin and propranolol.   Recheck.  F/u prn. Patient voiced understanding and agreement to the plan.  Shellie Dials [place], DO 12/17/23 7:14 AM

## 2023-12-17 NOTE — Patient Instructions (Addendum)
 Aim to do some physical exertion for 150 minutes per week. This is typically divided into 5 days per week, 30 minutes per day. The activity should be enough to get your heart rate up. Anything is better than nothing if you have time constraints.  Please consider counseling. Contact [phone] to schedule an appointment or inquire about cost/insurance coverage.  [facility] located at [address], [place], [place].  Phone number = [phone].  Dr. Dewight Fore - Adult Psychiatry.    [facility] located at [address] [place], [place], [place]. Phone number = [phone].   [facility] located at [address] [place], [place].  Phone number = [phone].   [facility] located at [address] [place], [place], [place].  Phone number = [phone].  If you change your mind about the medicine, please let me know.   Coping skills Choose 5 that work for you: Take a deep breath Count to 20 Read a book Do a puzzle Meditate Bake Sing Knit Garden Pray Go outside Call a friend Listen to music Take a walk Color Send a note Take a bath Watch a movie Be alone in a quiet place Pet an animal Visit a friend Journal Exercise Stretch   Let us  know if you need anything.

## 2023-12-24 ENCOUNTER — Other Ambulatory Visit: Payer: Self-pay | Admitting: Family Medicine

## 2023-12-24 MED ORDER — PROPRANOLOL HCL 10 MG PO TABS: ORAL_TABLET | ORAL | 1 refills | Status: DC

## 2023-12-27 NOTE — Telephone Encounter (Signed)
 Called pt and sent mychart message, asking pt to call and schedule Med check for 3-4 weeks.

## 2024-01-05 ENCOUNTER — Ambulatory Visit: Admitting: Licensed Clinical Social Worker

## 2024-01-05 DIAGNOSIS — F329 Major depressive disorder, single episode, unspecified: Secondary | ICD-10-CM | POA: Diagnosis not present

## 2024-01-05 DIAGNOSIS — F411 Generalized anxiety disorder: Secondary | ICD-10-CM
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
## 2024-01-11 DIAGNOSIS — F411 Generalized anxiety disorder: Secondary | ICD-10-CM | POA: Diagnosis not present

## 2024-01-13 ENCOUNTER — Encounter: Payer: Self-pay | Admitting: Licensed Clinical Social Worker

## 2024-01-13 ENCOUNTER — Ambulatory Visit: Admitting: Licensed Clinical Social Worker

## 2024-01-13 DIAGNOSIS — F411 Generalized anxiety disorder: Secondary | ICD-10-CM | POA: Diagnosis not present

## 2024-01-13 DIAGNOSIS — F329 Major depressive disorder, single episode, unspecified: Secondary | ICD-10-CM | POA: Diagnosis not present

## 2024-01-13 NOTE — Progress Notes (Signed)
 [facility] Counselor/Therapist Progress Note  Patient ID: Jared Simpson, MRN: [id number]   Date: 01/13/24  Time Spent: 9:01  am - 9:54 am : 53 Minutes  Treatment Type: Individual Therapy.  Reported Symptoms: feeling like a failure as a father and husband, burnt out, fatigued and unsure, overstimulated and frustrated   Mental Status Exam: Appearance:  Casual     Behavior: Sharing and Rationalizing  Motor: Normal  Speech/Language:  Normal Rate  Affect: Depressed and Flat  Mood: depressed  Thought process: normal  Thought content:   WNL  Sensory/Perceptual disturbances:   WNL  Orientation: oriented to person, place, and time/date  Attention: Good  Concentration: Good  Memory: WNL  Fund of knowledge:  Good  Insight:   Good  Judgment:  Good  Impulse Control: Good   Risk Assessment: Danger to Self:  No Self-injurious Behavior: No Danger to Others: No Duty to Warn:no Physical Aggression / Violence:No  Access to Firearms a concern: No  Gang Involvement:No   Subjective:   Deberah Falconer participated from home, via video and consented to treatment. Therapist participated from home office. I discussed the limitations of evaluation and management by telemedicine and the availability of in person appointments. The patient expressed understanding and agreed to proceed. Deveion reviewed the events of the past week. Patient had technical issues with sound and video at the start of session.  Moved from car to standing outside of home for better wi-fi connection.       We reviewed numerous treatment approaches including CBT and Solution focused therapy. Psych-education regarding the Graeson's diagnosis of No diagnosis found. was provided during the session. We discussed Aloys Bowker's goals treatment goals which include Pressure from a highly demanding (uncompromising) partner  History of substance use, bipolar disorder, depression, and anxiety  Patterns of people-pleasing and  self-sabotage  Low self-esteem tied to underemployment  A desire to restore the marriage, increase income, and develop assertive communication. Deberah Falconer provided verbal approval of the treatment plan.   Interventions: Psycho-education & Goal Setting.   Diagnosis:  GAD & reactive Depression  Psychiatric Treatment: No , N/A   Treatment Plan:  Client Abilities/Strengths Dredyn is open and receptive to sessions.    Support System: Family and Friends  Health and safety inspector  Treatment Level Weekly  Symptoms  Defeated   (Status: maintained) Sad/Disappointed/Failure   (Status: maintained)  Goals:   Monta agreed to set goal of  attending to pressure from a highly demanding (uncompromising) partner, with consideration for self and historical experiences of substance use, bipolar disorder, depression, and anxiety.  Attend to patterns of people-pleasing and self-sabotage, low self-esteem tied to underemployment and a desire to restore the marriage, increase income, and develop assertive communication with his wife.    Treatment plan signed and available on s-drive:  Yes    Target Date: 03/09/24 Frequency: Weekly  Progress: 0 Modality: individual    Therapist will provide referrals for additional resources as appropriate.  Therapist will provide psycho-education regarding Gustaf's diagnosis and corresponding treatment approaches and interventions. Licensed Clinical Mental Health Counselor, Grandville Lax, [facility] will support the patient's ability to achieve the goals identified. will employ CBT, BA, Problem-solving, Solution Focused, Mindfulness,  coping skills, & other evidenced-based practices will be used to promote progress towards healthy functioning to help manage decrease symptoms associated with his diagnosis.   Reduce overall level, frequency, and intensity of the feelings of depression, anxiety and panic evidenced by assertive communication and advocacy,  increased awareness of  self and needs in order to set and reinforce healthy boundaries.   Verbally express understanding of the relationship between feelings of depression, anxiety and their impact on thinking patterns and behaviors. Verbalize an understanding of the role that distorted thinking plays in creating fears, excessive worry, and ruminations.    Lavonia Powers participated in the creation of the treatment plan)    Shaunak Kreis, LCMHC

## 2024-01-17 ENCOUNTER — Ambulatory Visit: Admitting: Family Medicine

## 2024-01-20 ENCOUNTER — Ambulatory Visit: Admitting: Licensed Clinical Social Worker

## 2024-01-20 DIAGNOSIS — F411 Generalized anxiety disorder: Secondary | ICD-10-CM | POA: Diagnosis not present

## 2024-01-20 DIAGNOSIS — F329 Major depressive disorder, single episode, unspecified: Secondary | ICD-10-CM

## 2024-01-20 NOTE — Progress Notes (Signed)
 [facility] Counselor/Therapist Progress Note  Patient ID: Jared Simpson, MRN: [id number]    Date: 01/20/24  Time Spent: 9:02  am - 9:58 am : 56 Minutes  Treatment Type: Individual Therapy.  Reported Symptoms: feeling better and progressive in getting along with wife, utilizing strategies from sessions   Mental Status Exam: Appearance:  Well Groomed     Behavior: Appropriate, Sharing, and Rationalizing  Motor: Normal  Speech/Language:  Normal Rate  Affect: Appropriate  Mood: normal  Thought process: normal  Thought content:   WNL  Sensory/Perceptual disturbances:   WNL  Orientation: oriented to person, place, and time/date  Attention: Good  Concentration: Good  Memory: WNL  Fund of knowledge:  Good  Insight:   Good  Judgment:  Good  Impulse Control: Good   Risk Assessment: Danger to Self:  No Self-injurious Behavior: No Danger to Others: No Duty to Warn:no Physical Aggression / Violence:No  Access to Firearms a concern: No  Gang Involvement:No   Subjective:   Jared Simpson participated from home, via video, and consented to treatment. I discussed the limitations of evaluation and management by telemedicine and the availability of in person appointments. The patient expressed understanding and agreed to proceed.  Therapist participated from home office.  Jared Simpson reviewed the events of the past week.      Interventions: Cognitive Behavioral Therapy and Mindfulness Meditation  Diagnosis:  GAD and Reactive Depression   Psychiatric Treatment: No , N/A  Treatment Plan:  Client Abilities/Strengths Jared Simpson is open to sessions.    Support System: Family and Friends   Merchant navy officer of Needs Jared Simpson would like to focus on his feelings of guilt in putting himself first.     Treatment Level Weekly  Symptoms  Calm   (Status: improved) In control/In tune/Observant   (Status: improved)  Goals:   Jared Simpson  symptoms of residuals from his mother managing her Bipolar disorder as a child.  Created fear as an adult in self expression and advocacy.  Jared Simpson/Jared Simpson responses out of fear and not wanting to poke the bear and create more stress.  Last on list and focused on others well being.  Body Alarm System, Journaling, and SOS.     Target Date: 03/09/24 Frequency: Weekly  Progress: 0 Modality: individual    Therapist will provide referrals for additional resources as appropriate.  Therapist will provide psycho-education regarding Jared Simpson's diagnosis and corresponding treatment approaches and interventions. Licensed Clinical Mental Health Counselor, Tawni Louder, [facility] will support the patient's ability to achieve the goals identified. will employ CBT, BA, Problem-solving, Solution Focused, Mindfulness,  coping skills, & other evidenced-based practices will be used to promote progress towards healthy functioning to help manage decrease symptoms associated with his diagnosis.   Reduce overall level, frequency, and intensity of the feelings of depression, anxiety and panic evidenced by increased feelings of self awareness and responsiveness to reduce resentment, guilt and shame -people pleasing and self sabotage.   Verbally express understanding of the relationship between feelings of depression, anxiety and their impact on thinking patterns and behaviors. Verbalize an understanding of the role that distorted thinking plays in creating fears, excessive worry, and ruminations.  Jared Simpson participated in the creation of the treatment plan)   Aidee Latimore, LCMHC

## 2024-02-01 ENCOUNTER — Ambulatory Visit: Admitting: Licensed Clinical Social Worker

## 2024-02-02 ENCOUNTER — Ambulatory Visit: Admitting: Licensed Clinical Social Worker

## 2024-02-08 DIAGNOSIS — F411 Generalized anxiety disorder: Secondary | ICD-10-CM | POA: Diagnosis not present

## 2024-02-10 ENCOUNTER — Ambulatory Visit: Payer: Self-pay | Admitting: Licensed Clinical Social Worker

## 2024-02-10 DIAGNOSIS — F329 Major depressive disorder, single episode, unspecified: Secondary | ICD-10-CM | POA: Diagnosis not present

## 2024-02-10 DIAGNOSIS — F411 Generalized anxiety disorder: Secondary | ICD-10-CM | POA: Diagnosis not present

## 2024-02-10 NOTE — Progress Notes (Signed)
 [facility] Counselor/Therapist Progress Note  Patient ID: Jared Simpson, MRN: [id number]    Date: 02/10/24  Time Spent: 9:00  am - 10:00 am : 60 Minutes  Treatment Type: Individual Therapy.  Reported Symptoms: pumping the breaks and taking a step back from feeling obligated to help others due to burn out, feeling like a shell  Mental Status Exam: Appearance:  Well Groomed     Behavior: Appropriate and Sharing  Motor: Normal  Speech/Language:  Normal Rate  Affect: Appropriate  Mood: normal  Thought process: normal  Thought content:   WNL  Sensory/Perceptual disturbances:   WNL  Orientation: oriented to person, place, and time/date  Attention: Good  Concentration: Good  Memory: WNL  Fund of knowledge:  Good  Insight:   Good  Judgment:  Good  Impulse Control: Good   Risk Assessment: Danger to Self:  No Self-injurious Behavior: No Danger to Others: No Duty to Warn:no Physical Aggression / Violence:No  Access to Firearms a concern: No  Gang Involvement:No   Subjective:   Jared Simpson participated from home, via video, and consented to treatment. I discussed the limitations of evaluation and management by telemedicine and the availability of in person appointments. The patient expressed understanding and agreed to proceed.  Therapist participated from home office.  Jared Simpson reviewed the events of the past week.      Interventions: Cognitive Behavioral Therapy, Mindfulness Meditation, and Psycho-education/Bibliotherapy  Diagnosis:  GAD and Reactive Depression  Psychiatric Treatment: No , NA  Treatment Plan:  Client Abilities/Strengths Jared Simpson is open to sessions.    Support System: Family and Friends   Administrator, Civil Service of Needs Jared Simpson would like to focus on his feelings of guilt in putting himself first.      Treatment Level Weekly  Symptoms  Mindfully connected   (Status: improved) Asking/Requesting     (Status: improved)  Goals:   Jared Simpson experiences symptoms of being more aware of what he is feeling and how to articulate his needs/wants with people (work and Copywriter, advertising).  He has learned that since doing so, it has been a positive experience with people being willing to oblige his request.  Fear had hindered his ability to ask thinking it was going to be a negative outcome.  Alarm review, SOS/WRAP and gratitude  journal.     Target Date: 03/09/24 Frequency: Weekly  Progress: 0 Modality: individual    Therapist will provide referrals for additional resources as appropriate.  Therapist will provide psycho-education regarding Jared Simpson diagnosis and corresponding treatment approaches and interventions. Licensed Clinical Mental Health Counselor, Tawni Louder, [facility] will support the patient's ability to achieve the goals identified. will employ CBT, BA, Problem-solving, Solution Focused, Mindfulness,  coping skills, & other evidenced-based practices will be used to promote progress towards healthy functioning to help manage decrease symptoms associated with his diagnosis.   Reduce overall level, frequency, and intensity of the feelings of depression, anxiety and panic evidenced by decreased burnout from overextending, people pleasing and lack of boundaries.   Verbally express understanding of the relationship between feelings of depression, anxiety and their impact on thinking patterns and behaviors. Verbalize an understanding of the role that distorted thinking plays in creating fears, excessive worry, and ruminations.  [name] participated in the creation of the treatment plan)   Darcy Barbara, LCMHC

## 2024-02-17 ENCOUNTER — Ambulatory Visit: Payer: Self-pay | Admitting: Licensed Clinical Social Worker

## 2024-02-17 DIAGNOSIS — F329 Major depressive disorder, single episode, unspecified: Secondary | ICD-10-CM | POA: Diagnosis not present

## 2024-02-17 DIAGNOSIS — F411 Generalized anxiety disorder: Secondary | ICD-10-CM | POA: Diagnosis not present

## 2024-02-17 NOTE — Progress Notes (Signed)
 Jared Simpson  Patient ID: Jared Simpson, MRN: [id number]    Date: 02/17/24  Time Spent: 10:00  am - 10:56 am : 56 Minutes  Treatment Type: Individual Therapy.  Reported Symptoms: feels like he is juggling and navigating this, continuing to say no and reduce pressure to perform  Mental Status Exam: Appearance:  Well Groomed     Behavior: Sharing and Rationalizing  Motor: Normal  Speech/Language:  Normal Rate  Affect: Appropriate  Mood: normal  Thought process: normal  Thought content:   WNL  Sensory/Perceptual disturbances:   WNL  Orientation: oriented to person, place, and time/date  Attention: Good  Concentration: Good  Memory: WNL  Fund of knowledge:  Good  Insight:   Good  Judgment:  Good  Impulse Control: Good   Risk Assessment: Danger to Self:  No Self-injurious Behavior: No Danger to Others: No Duty to Warn:no Physical Aggression / Violence:No  Access to Firearms a concern: No  Gang Involvement:No   Subjective:   Jared Simpson participated from home, via video, and consented to treatment. I discussed the limitations of evaluation and management by telemedicine and the availability of in person appointments. The patient expressed understanding and agreed to proceed.  Therapist participated from home office.  Jared Simpson reviewed the events of the past week.      Interventions: Cognitive Behavioral Therapy and Mindfulness Meditation  Diagnosis:  Generalized anxiety disorder  Reactive depression  Psychiatric Treatment: No , N/A  Treatment Plan:  Client Abilities/Strengths Jared Simpson is open to sessions.    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    Treatment Level Weekly  Symptoms  Worried/Powerless   (Status: maintained) Guilt   (Status: maintained)  Goals:   Jared Simpson  experiences symptoms of feeling resentment, guilt and that he is neglecting his mother as she still struggles with her addictions and mental health conditions.  Some siblings do not engage but feels some guilt in not beig there for mom as she was a support for him when no one was during his own addiction battle.  Not sure what he needs/wants from her at current capacity.  Journal and process these emotions offline for review in next session along with Make A Link prep.     Target Date: 03/09/24 Frequency: Weekly  Progress: 0 Modality: individual    Therapist will provide referrals for additional resources as appropriate.  Therapist will provide psycho-education regarding Jared Simpson's diagnosis and corresponding treatment approaches and interventions. Licensed Clinical Mental Health Counselor, Jared Simpson, [facility] will support the patient's ability to achieve the goals identified. will employ CBT, BA, Problem-solving, Solution Focused, Mindfulness,  coping skills, & other evidenced-based practices will be used to promote progress towards healthy functioning to help manage decrease symptoms associated with her diagnosis.   Reduce overall level, frequency, and intensity of the feelings of depression, anxiety and panic evidenced by decreased guilt and resentment. Between feelings of depression, anxiety and their impact on thinking patterns and behaviors. Verbalize an understanding of the role that distorted thinking plays in creating fears, excessive worry, and ruminations.  Jared Simpson participated in the creation of the treatment plan)   Jared Simpson, LCMHC

## 2024-02-24 ENCOUNTER — Ambulatory Visit: Payer: Self-pay | Admitting: Licensed Clinical Social Worker

## 2024-02-24 DIAGNOSIS — F411 Generalized anxiety disorder: Secondary | ICD-10-CM | POA: Diagnosis not present

## 2024-02-24 DIAGNOSIS — F329 Major depressive disorder, single episode, unspecified: Secondary | ICD-10-CM

## 2024-02-24 NOTE — Progress Notes (Signed)
 [facility] Counselor/Therapist Progress Note  Patient ID: Jared Simpson, MRN: [id number]    Date: 02/24/24  Time Spent: 10:03  am - 10:58 am : 55 Minutes  Treatment Type: Individual Therapy.  Reported Symptoms: working on saying no more, feeling tired and burnt out  Mental Status Exam: Appearance:  Well Groomed     Behavior: Sharing  Motor: Normal  Speech/Language:  Normal Rate  Affect: Appropriate  Mood: normal  Thought process: normal  Thought content:   WNL  Sensory/Perceptual disturbances:   WNL  Orientation: oriented to person, place, and time/date  Attention: Good  Concentration: Good  Memory: WNL  Fund of knowledge:  Good  Insight:   Good  Judgment:  Good  Impulse Control: Good   Risk Assessment: Danger to Self:  No Self-injurious Behavior: No Danger to Others: No Duty to Warn:no Physical Aggression / Violence:No  Access to Firearms a concern: No  Gang Involvement:No   Subjective:   [name] participated from home, via video, and consented to treatment. I discussed the limitations of evaluation and management by telemedicine and the availability of in person appointments. The patient expressed understanding and agreed to proceed.  Therapist participated from home office.  Mauricio reviewed the events of the past week.      Interventions: Mindfulness Meditation and Insight-Oriented  Diagnosis:  Generalized anxiety disorder  Psychiatric Treatment: No , N/A  Treatment Plan:  Client Abilities/Strengths Luby is open to sessions.   Support System: Family and Friends   Merchant navy officer of Needs Abishai would like to focus on his feelings of guilt in putting himself first.       Treatment Level Weekly  Symptoms  Happy   (Status: improved) Confident   (Status: improved)  Goals:   Jakye experiences symptoms of of assessing values.  Accepting mom for who she is and not try to change that, expect  nothing from her and and more aware that she is sick person.  Value systems-Top 8 Happiness, Love, Stability and relaxation top 4.  Creating joyful moments that heal the childhood regrets but provide gratitude and value in present time.  Childhood arts and crafts with the kids over the weekend-intentional healing.     Target Date: 03/09/24 Frequency: Weekly  Progress: 0 Modality: individual    Therapist will provide referrals for additional resources as appropriate.  Therapist will provide psycho-education regarding Michael's diagnosis and corresponding treatment approaches and interventions. Licensed Clinical Mental Health Counselor, Tawni Louder, [facility] will support the patient's ability to achieve the goals identified. will employ CBT, BA, Problem-solving, Solution Focused, Mindfulness,  coping skills, & other evidenced-based practices will be used to promote progress towards healthy functioning to help manage decrease symptoms associated with his diagnosis.   Reduce overall level, frequency, and intensity of the feelings of depression, anxiety and panic evidenced by decreased negative thinking and regret over ACE's.   Verbally express understanding of the relationship between feelings of depression, anxiety and their impact on thinking patterns and behaviors. Verbalize an understanding of the role that distorted thinking plays in creating fears, excessive worry, and ruminations.  [name] participated in the creation of the treatment plan)   Shontel Santee, LCMHC

## 2024-03-06 ENCOUNTER — Ambulatory Visit: Payer: Self-pay | Admitting: Licensed Clinical Social Worker

## 2024-03-06 DIAGNOSIS — F329 Major depressive disorder, single episode, unspecified: Secondary | ICD-10-CM | POA: Diagnosis not present

## 2024-03-06 DIAGNOSIS — F411 Generalized anxiety disorder: Secondary | ICD-10-CM

## 2024-03-06 NOTE — Progress Notes (Signed)
 [facility] Counselor/Therapist Progress Note  Patient ID: Jared Simpson, MRN: [id number]    Date: 03/06/24  Time Spent: 9:01  am - 9:55 am : 54 Minutes  Treatment Type: Individual Therapy.  Reported Symptoms: feeling like he os not have time to rest and playing catch up, working on saying no but still not enough time  Mental Status Exam: Appearance:  Well Groomed     Behavior: Appropriate  Motor: Normal  Speech/Language:  Normal Rate  Affect: Appropriate  Mood: normal  Thought process: normal  Thought content:   WNL  Sensory/Perceptual disturbances:   WNL  Orientation: oriented to person, place, and time/date  Attention: Good  Concentration: Good  Memory: WNL  Fund of knowledge:  Good  Insight:   Good  Judgment:  Good  Impulse Control: Good   Risk Assessment: Danger to Self:  No Self-injurious Behavior: No Danger to Others: No Duty to Warn:no Physical Aggression / Violence:No  Access to Firearms a concern: No  Gang Involvement:No   Subjective:   Jared Simpson participated from home, via video, and consented to treatment. I discussed the limitations of evaluation and management by telemedicine and the availability of in person appointments. The patient expressed understanding and agreed to proceed.  Therapist participated from home office.  Jared Simpson reviewed the events of the past week. Screened for emotional security in maintaining sobriety and patient reassured he his doing well and not in jeopardy of a relapse at this time.       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     Treatment Level Weekly  Symptoms  Overwhelmed   (Status: declined) Joyful   (Status: maintained)  Goals:   Jared Simpson symptoms of regressing, feeling overwhelmed, very tearful and the stress of demands and expectations.  Feels his wife does not hold space for him and it feels like a competition and full of resentment on both sides.  Wife can feel resentment as he finds joy in spending time with Jared Simpson and she has become jealous of this.  They are seeing a marriage therapist but it has been stagnant with no growth (2 years and only once a month).  Jared Simpson and referral provided by therapist to get into see a new solution focused therapist.  Planning to speak with sister, mom and wife about boundaries and needs.     Target Date: 03/09/24 Frequency: Weekly  Progress: 83% Modality: individual    Therapist will provide referrals for additional resources as appropriate.  Therapist will provide psycho-education regarding Jared Simpson diagnosis and corresponding treatment approaches and interventions. Licensed Clinical Mental Health Counselor, Jared Simpson, [facility] will support the patient's ability to achieve the goals identified. will employ CBT, BA, Problem-solving, Solution Focused, Mindfulness,  coping skills, & other evidenced-based practices will be used to promote progress towards healthy functioning to help manage decrease symptoms associated with his diagnosis.   Reduce overall level, frequency, and intensity of the feelings of depression, anxiety and panic evidenced by decreased self sabotage and people pleasing by compromising self for others. Verbally express understanding of the relationship between feelings of depression, anxiety and their  impact on thinking patterns and behaviors. Verbalize an understanding of the role that distorted thinking plays in creating fears, excessive worry, and ruminations.  [name]  participated in the creation of the treatment plan)   Lemonte Al, LCMHC

## 2024-03-07 DIAGNOSIS — F411 Generalized anxiety disorder: Secondary | ICD-10-CM | POA: Diagnosis not present

## 2024-03-13 ENCOUNTER — Ambulatory Visit (INDEPENDENT_AMBULATORY_CARE_PROVIDER_SITE_OTHER): Payer: Self-pay | Admitting: Licensed Clinical Social Worker

## 2024-03-13 DIAGNOSIS — F329 Major depressive disorder, single episode, unspecified: Secondary | ICD-10-CM | POA: Diagnosis not present

## 2024-03-13 DIAGNOSIS — F411 Generalized anxiety disorder: Secondary | ICD-10-CM

## 2024-03-13 NOTE — Progress Notes (Signed)
 [facility] Counselor/Therapist Progress Note  Patient ID: Jared Simpson, MRN: [id number]    Date: 03/13/24  Time Spent: 12:03  pm - 12:58 pm : 55 Minutes  Treatment Type: Individual Therapy.  Reported Symptoms: faced boundary conversations with loved ones  Mental Status Exam: Appearance:  Well Groomed     Behavior: Appropriate and Sharing  Motor: Normal  Speech/Language:  Normal Rate  Affect: Appropriate  Mood: normal  Thought process: normal  Thought content:   WNL  Sensory/Perceptual disturbances:   WNL  Orientation: oriented to person, place, and time/date  Attention: Good  Concentration: Good  Memory: WNL  Fund of knowledge:  Good  Insight:   Good  Judgment:  Good  Impulse Control: Good   Risk Assessment: Danger to Self:  No Self-injurious Behavior: No Danger to Others: No Duty to Warn:no Physical Aggression / Violence:No  Access to Firearms a concern: No  Gang Involvement:No   Subjective:   [name] participated from home, via video, and consented to treatment. I discussed the limitations of evaluation and management by telemedicine and the availability of in person appointments. The patient expressed understanding and agreed to proceed.  Therapist participated from home office.  Stepfon reviewed the events of the past week.      Interventions: Assertiveness/Communication and Solution-Oriented/Positive Psychology  Diagnosis:  Generalized anxiety disorder  Reactive depression  Psychiatric Treatment: No , N/A  Treatment Plan:  Client Abilities/Strengths Voshon is open to sessions.    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     Treatment Level Weekly  Symptoms  Relieved/In Control   (Status: improved) Making progress   (Status: improved)  Goals:   Mataeo experiences symptoms of his wife being more  flexible and open to his leadership and suggestions.  Lots of hard conversations with partner as part of our goal setting that have created a better flow and strengthened their communication.  Considering a new couples therapist as they current one they have been with 2 years and reached a stagnant wall.  Living more on purpose and allocating time for self and family.   Original Goal: A desire to restore the marriage, increase income, and develop assertive communication 03/09/24 Target Date: 03/13/24 Frequency: Weekly  Progress: 0 Modality: individual    Therapist will provide referrals for additional resources as appropriate.  Therapist will provide psycho-education regarding Piper's diagnosis and corresponding treatment approaches and interventions. Licensed Clinical Mental Health Counselor, Tawni Louder, [facility] will support the patient's ability to achieve the goals identified. will employ CBT, BA, Problem-solving, Solution Focused, Mindfulness,  coping skills, & other evidenced-based practices will be used to promote progress towards healthy functioning to help manage decrease symptoms associated with his diagnosis.   Reduce overall level, frequency, and intensity of the feelings of depression, anxiety and panic evidenced by increased ability to utilize assertive communication and maintain this level of independence.   Verbally express understanding of the relationship between feelings of depression, anxiety and their impact on thinking patterns and behaviors. Verbalize an understanding of the role that distorted thinking plays in creating fears, excessive worry, and ruminations.  [name] participated in the creation of the treatment plan)   Zacherie Honeyman, LCMHC

## 2024-03-20 ENCOUNTER — Ambulatory Visit (INDEPENDENT_AMBULATORY_CARE_PROVIDER_SITE_OTHER): Admitting: Licensed Clinical Social Worker

## 2024-03-20 DIAGNOSIS — F411 Generalized anxiety disorder: Secondary | ICD-10-CM

## 2024-03-20 DIAGNOSIS — F329 Major depressive disorder, single episode, unspecified: Secondary | ICD-10-CM

## 2024-03-20 NOTE — Progress Notes (Signed)
 [facility] Counselor/Therapist Progress Note  Patient ID: Amarius Toto, MRN: [id number]    Date: 03/20/24  Time Spent: 9:02  am - 9:57 am : 55 Minutes  Treatment Type: Individual Therapy.  Reported Symptoms: overwhelmed and cannot find time for self which is causing a delay in progression  Mental Status Exam: Appearance:  Well Groomed     Behavior: Appropriate, Sharing, and Rationalizing  Motor: Normal  Speech/Language:  Normal Rate  Affect: Appropriate  Mood: anxious  Thought process: normal  Thought content:   WNL  Sensory/Perceptual disturbances:   WNL  Orientation: oriented to person, place, and time/date  Attention: Good  Concentration: Good  Memory: WNL  Fund of knowledge:  Good  Insight:   Good  Judgment:  Good  Impulse Control: Good   Risk Assessment: Danger to Self:  No Self-injurious Behavior: No Danger to Others: No Duty to Warn:no Physical Aggression / Violence:No  Access to Firearms a concern: No  Gang Involvement:No   Subjective:   [name] participated from home, via video, and consented to treatment. I discussed the limitations of evaluation and management by telemedicine and the availability of in person appointments. The patient expressed understanding and agreed to proceed.  Therapist participated from home office.  Maccoy reviewed the events of the past week.      Interventions: Assertiveness/Communication, Insight-Oriented, and Career Development  Diagnosis:  GAD and Reactive Depression   Psychiatric Treatment: No , N/A  Treatment Plan:  Client Abilities/Strengths Finis is open to sessions.    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  New goal interest focus on career development and also desires to go back to school.    Treatment Level Biweekly  Symptoms  Accomplished    (Status: improved) Anxious/Fearful of unknown   (Status: maintained)  Goals:   Rider experiences symptoms of pushing himself through this goal.  He realized it was harder to continue in the current patterns and had to push through.  His wife has been hesitant but now on board and they are both happier and it has been a healthy start for them.  New Goal: Break the cycle to create better for his family.  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     Target Date: 05/15/24 Frequency: Biweekly  Progress: 85% Modality: individual    Therapist will provide referrals for additional resources as appropriate.  Therapist will provide psycho-education regarding Evart's diagnosis and corresponding treatment approaches and interventions. Licensed Clinical Mental Health Counselor, Tawni Louder, [facility] will support the patient's ability to achieve the goals identified. will employ CBT, BA, Problem-solving, Solution Focused, Mindfulness,  coping skills, & other evidenced-based practices will be used to promote progress towards healthy functioning to help manage decrease symptoms associated with his diagnosis.   Reduce overall level, frequency, and intensity of the feelings of depression, anxiety and panic evidenced by decreased negative thinking and fear of the unknown verus the fear of staying the same and stagnant.   Verbally express understanding of the relationship between feelings of depression, anxiety and their impact on thinking patterns and behaviors. Verbalize an understanding of the role that distorted thinking plays in creating fears, excessive worry, and ruminations.  [name] participated in the creation of the treatment plan)  Tawni Louder, LCMHC

## 2024-04-03 ENCOUNTER — Ambulatory Visit: Admitting: Licensed Clinical Social Worker

## 2024-04-17 ENCOUNTER — Ambulatory Visit (INDEPENDENT_AMBULATORY_CARE_PROVIDER_SITE_OTHER): Admitting: Licensed Clinical Social Worker

## 2024-04-17 DIAGNOSIS — F411 Generalized anxiety disorder: Secondary | ICD-10-CM | POA: Diagnosis not present

## 2024-04-17 NOTE — Progress Notes (Signed)
 [facility] Counselor/Therapist Progress Note  Patient ID: Jared Simpson, MRN: [id number]    Date: 04/17/24  Time Spent: 9:02  pm - 9:57 am : 55 Minutes  Treatment Type: Individual Therapy.  Reported Symptoms: reported feeling more grounded and assured of himself and confidence level   Mental Status Exam: Appearance:  Well Groomed     Behavior: Appropriate  Motor: Normal  Speech/Language:  Normal Rate  Affect: Appropriate  Mood: normal  Thought process: normal  Thought content:   WNL  Sensory/Perceptual disturbances:   WNL  Orientation: oriented to person, place, and time/date  Attention: Good  Concentration: Good  Memory: WNL  Fund of knowledge:  Good  Insight:   Good  Judgment:  Good  Impulse Control: Good   Risk Assessment: Danger to Self:  No Self-injurious Behavior: No Danger to Others: No Duty to Warn:no Physical Aggression / Violence:No  Access to Firearms a concern: No  Gang Involvement:No   Subjective:   Jared Simpson participated from home, via video, and consented to treatment. I discussed the limitations of evaluation and management by telemedicine and the availability of in person appointments. The patient expressed understanding and agreed to proceed.  Therapist participated from home office.  Jared Simpson reviewed the events of the past week. He reports feeling empowered and more confident in self since the last session, attributing progress to the use of acceptance and self-care strategies. States improved communication and boundary-setting within the marriage, taking a more active leadership role. Acknowledges spouse's recognition of his mother's mental health challenges due to a history of drug addiction, noting her childlike and defiant behaviors, including refusal to attend their daughter's birthday or engage in alternative celebration efforts. Spouse expressed commitment to safeguarding the family while leaving space for potential reconnection. Patient  denies current depressive symptoms but reports ongoing experiences of anxiety, described as "manic" when unable to slow down.     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  New goal interest focus on career development and also desires to go back to school.     Treatment Level Biweekly  Symptoms  Empowered   (Status: improved) Confident   (Status: improved)  Goals: Patient appears more self-assured and exhibits insight into relational dynamics. Affect is congruent. No evidence of depressive symptoms noted. Reports ongoing anxiety with improved management using newly implemented coping tools. Reactive Depression: In remission  Generalized Anxiety Disorder: Ongoing, but with improved symptom management Progress noted in emotional regulation, self-awareness, and interpersonal boundaries. Continued work needed on anxiety modulation and mindfulness practices. Reinforce use of SOS method and stillness practice (2, 5, or 10 minutes) for emotional regulation.  Jared Simpson   Target Date: 05/15/24 Frequency: Biweekly  Progress: 0 Modality: individual    Therapist will provide referrals for additional resources as appropriate.  Therapist will provide psycho-education regarding Jared Simpson's diagnosis and corresponding treatment approaches and interventions. Licensed Clinical Mental Health Counselor, Tawni Louder, [facility] will support the patient's ability to achieve the goals identified. will employ CBT, BA, Problem-solving, Solution Focused, Mindfulness,  coping skills, & other evidenced-based practices will be used to promote  progress towards healthy functioning to help manage decrease symptoms associated with his diagnosis.   Reduce  overall level, frequency, and intensity of the feelings of depression, anxiety and panic evidenced by decreased freezing anxiety that impedes his ability to settle.   Verbally express understanding of the relationship between feelings of depression, anxiety and their impact on thinking patterns and behaviors. Verbalize an understanding of the role that distorted thinking plays in creating fears, excessive worry, and ruminations.  Jared Simpson participated in the creation of the treatment plan)   Farzana Koci, LCMHC

## 2024-05-01 ENCOUNTER — Ambulatory Visit (INDEPENDENT_AMBULATORY_CARE_PROVIDER_SITE_OTHER): Admitting: Licensed Clinical Social Worker

## 2024-05-01 DIAGNOSIS — F411 Generalized anxiety disorder: Secondary | ICD-10-CM

## 2024-05-01 NOTE — Progress Notes (Signed)
 [facility] Counselor/Therapist Progress Note  Patient ID: Jared Simpson, MRN: [id number]    Date: 05/01/24  Time Spent: 9:04  am - 10:03 am : 59 Minutes  Treatment Type: Individual Therapy.  Reported Symptoms: bogged down and pressure on self to get things right, not communicating effectively with wife and stagnant with moving to a new therapist   Mental Status Exam: Appearance:  Well Groomed     Behavior: Appropriate  Motor: Normal  Speech/Language:  Normal Rate  Affect: Appropriate  Mood: normal  Thought process: normal  Thought content:   WNL  Sensory/Perceptual disturbances:   WNL  Orientation: oriented to person, place, and time/date  Attention: Good  Concentration: Good  Memory: WNL  Fund of knowledge:  Good  Insight:   Good  Judgment:  Good  Impulse Control: Good   Risk Assessment: Danger to Self:  No Self-injurious Behavior: No Danger to Others: No Duty to Warn:no Physical Aggression / Violence:No  Access to Firearms a concern: No  Gang Involvement:No   Subjective:   Jared Simpson participated from home, via video, and consented to treatment. I discussed the limitations of evaluation and management by telemedicine and the availability of in person appointments. The patient expressed understanding and agreed to proceed.  Therapist participated from home office.  Jared Simpson reviewed the events of the past week. Presents with ongoing stress related to marital discord and family strain. Reports increasing frustration in his marriage due to lack of connection, heavy conversations, and absence of compromise from his wife. Admits he has not followed through on selecting a couples therapist, citing his wife's lack of interest. Feels stuck and notes that while he is typically passive, his patience is wearing thin.  Expressed desire for his wife to witness the insights he gains in therapy, but recognizes that individual progress is limited without mutual  effort.  Additionally, discussed strained relationship with his mother, who is currently withholding contact after he chose to have his sister plan his daughter's birthday party instead of inviting her--due to longstanding conflict between the two. Patient recognizes this as part of a pattern related to her past substance use and emotional unavailability. His son has asked to see his grandmother, and patient is concerned about the emotional impact on him.  He agreed to support his son in writing a letter or card to his grandmother as a way to honor the child's emotions while maintaining his boundary with his mother. Patient remains focused on creating healthy boundaries and standards for future reconciliation, if and when his mother is ready to engage.     Interventions: Cognitive Behavioral Therapy, Assertiveness/Communication, Mindfulness Meditation, and Solution-Oriented/Positive Psychology  Diagnosis:  Generalized anxiety disorder  Psychiatric Treatment: No , N/A  Treatment Plan:  Client Abilities/Strengths Jared Simpson is open to sessions.   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  New goal interest focus on career development and also desires to go back to school.        Treatment Level Biweekly  Symptoms  insightful   (Status: improved) reflective   (Status: improved)  Goals:   Jared Simpson Marital stress with limited mutual engagement in resolution Improving boundary-setting skills Family of origin issues contributing to emotional burden Protective parenting and emotional awareness evident Motivation for growth remains high despite interpersonal barriers Continue therapy focused on Assertive communication and  emotional regulation  Navigating marital conflict and setting realistic  expectations-stagnant  Target Date: 05/15/24 Frequency: Biweekly  Progress: 0 Modality: individual    Therapist will provide referrals for additional resources as appropriate.  Therapist will provide psycho-education regarding Jared Simpson's diagnosis and corresponding treatment approaches and interventions. Licensed Clinical Mental Health Counselor, Tawni Louder, [facility] will support the patient's ability to achieve the goals identified. will employ CBT, BA, Problem-solving, Solution Focused, Mindfulness,  coping skills, & other evidenced-based practices will be used to promote progress towards healthy functioning to help manage decrease symptoms associated with his diagnosis.   Reduce overall level, frequency, and intensity of the feelings of depression, anxiety and panic evidenced by decreased lack of initiative and leadership in his marriage to gain clarity and conflict resolutions.  Verbally express understanding of the relationship between feelings of depression, anxiety and their impact on thinking patterns and behaviors. Verbalize an understanding of the role that distorted thinking plays in creating fears, excessive worry, and ruminations.  Jared Simpson participated in the creation of the treatment plan)   Jared Simpson, LCMHC

## 2024-05-15 ENCOUNTER — Ambulatory Visit (INDEPENDENT_AMBULATORY_CARE_PROVIDER_SITE_OTHER): Admitting: Licensed Clinical Social Worker

## 2024-05-15 DIAGNOSIS — F411 Generalized anxiety disorder: Secondary | ICD-10-CM | POA: Diagnosis not present

## 2024-05-15 NOTE — Progress Notes (Signed)
 [facility] Counselor/Therapist Progress Note  Patient ID: Jared Simpson, MRN: [id number]    Date: 05/15/24  Time Spent: 9:01  am - 9:56 am : 55 Minutes  Treatment Type: Individual Therapy.  Reported Symptoms: feeling like he is sucked into the cycle of negativity with his wife, she packed up and left this morning, has to police himself and walking on eggshells, tense  Mental Status Exam: Appearance:  Casual     Behavior: Sharing and Agitated  Motor: Normal  Speech/Language:  Normal Rate  Affect: Appropriate  Mood: angry and sad  Thought process: circumstantial  Thought content:   WNL  Sensory/Perceptual disturbances:   WNL  Orientation: oriented to person, place, and time/date  Attention: Good  Concentration: Good  Memory: WNL  Fund of knowledge:  Good  Insight:   Good  Judgment:  Good  Impulse Control: Good   Risk Assessment: Danger to Self:  No Self-injurious Behavior: No Danger to Others: No Duty to Warn:no Physical Aggression / Violence:No  Access to Firearms a concern: No  Gang Involvement:No   Subjective:   [name] participated from home, via video, and consented to treatment. I discussed the limitations of evaluation and management by telemedicine and the availability of in person appointments. The patient expressed understanding and agreed to proceed.  Therapist participated from home office.  Xiong reviewed the events of the past week. reported that his wife left the home this morning to stay with her parents following a night of arguing. He attempted to disengage by going to another room to sleep, but his wife continued engaging through the door and eventually woke their baby. Client identified that his wife is experiencing postpartum emotional challenges and is not managing them effectively in therapy. He expressed that she is currently unreceptive to his boundary-setting efforts and tends to shift blame and avoid accountability--a pattern he  observes not only with him but also with her parents and friends. This is the third time in their relationship that she has left the home during conflict.     Interventions: Assertiveness/Communication, Solution-Oriented/Positive Psychology, and Insight-Oriented  Diagnosis:  Generalized anxiety disorder  Psychiatric Treatment: No , N/A  Treatment Plan:  Client Abilities/Strengths Barnie is open to sessions. appeared emotionally grounded during the session. No signs of acute distress or crisis. He verbalized insight into the relational dynamics and demonstrated reflective thinking.  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  New goal interest focus on career development and also desires to go back to school.      Treatment Level Biweekly  Symptoms  Bewildered   (Status: declined) Optimistic   (Status: maintained)  Goals:   Justis is coping with relational strain and maintaining emotional regulation. He is setting healthy boundaries and recognizing dysfunctional patterns in the relationship. Client is open to reconciliation if partner engages in change but is also prepared for the possibility of separation if the pattern persists. He will meet with his father for support and perspective. Encouraged continued use of grounding techniques and healthy boundaries. Will monitor relational developments and reassess needs in next session.  Target Date: 05/15/24 Frequency: Biweekly  Progress: 0 Modality: individual    Therapist will provide referrals for additional resources as appropriate.  Therapist will provide psycho-education regarding Dewel's diagnosis and corresponding treatment approaches and interventions. Licensed Clinical Mental Health Counselor, Tawni Louder, [facility] will support the  patient's ability to achieve the goals identified. will  employ CBT, BA, Problem-solving, Solution Focused, Mindfulness,  coping skills, & other evidenced-based practices will be used to promote progress towards healthy functioning to help manage decrease symptoms associated with his diagnosis.   Reduce overall level, frequency, and intensity of the feelings of depression, anxiety and panic evidenced by decreased feelings of guilt and shaming from his wife's reactions from 6 to 7 days/week to 0 to 1 days/week per client report for at least 3 consecutive months. Verbally express understanding of the relationship between feelings of depression, anxiety and their impact on thinking patterns and behaviors. Verbalize an understanding of the role that distorted thinking plays in creating fears, excessive worry, and ruminations.  [name] participated in the creation of the treatment plan)   Shanty Ginty, LCMHC

## 2024-05-16 DIAGNOSIS — F411 Generalized anxiety disorder: Secondary | ICD-10-CM | POA: Diagnosis not present

## 2024-05-29 ENCOUNTER — Ambulatory Visit: Admitting: Licensed Clinical Social Worker

## 2024-05-29 DIAGNOSIS — F411 Generalized anxiety disorder: Secondary | ICD-10-CM | POA: Diagnosis not present

## 2024-05-29 NOTE — Progress Notes (Signed)
 [facility] Counselor/Therapist Progress Note  Patient ID: Jared Simpson, MRN: [id number]    Date: 05/29/24  Time Spent: 9:02  am - 9:58 am : 56 Minutes  Treatment Type: Individual Therapy.  Reported Symptoms: mentally exhausted and drained, tired   Mental Status Exam: Appearance:  Casual     Behavior: Appropriate, Sharing, and Rationalizing  Motor: Normal  Speech/Language:  Normal Rate  Affect: Appropriate  Mood: anxious and irritable  Thought process: circumstantial  Thought content:   WNL  Sensory/Perceptual disturbances:   WNL  Orientation: oriented to person, place, and time/date  Attention: Good  Concentration: Good  Memory: WNL  Fund of knowledge:  Good  Insight:   Good  Judgment:  Good  Impulse Control: Good   Risk Assessment: Danger to Self:  No Self-injurious Behavior: No Danger to Others: No Duty to Warn:no Physical Aggression / Violence:No  Access to Firearms a concern: No  Gang Involvement:No   Subjective:   Jared Simpson participated from home, via video, and consented to treatment. I discussed the limitations of evaluation and management by telemedicine and the availability of in person appointments. The patient expressed understanding and agreed to proceed.  Therapist participated from home office.  Jared Simpson reviewed the events of the past week. focus on his feelings of guilt in putting himself first. reports ongoing marital distress characterized by emotional disconnection and fear-based communication. He describes feeling unsafe expressing emotions due to partner's pattern of weaponizing feelings, leading him to withdraw and appear cold or distant. Client expresses tension regarding parenting differences and upcoming appointment with a new male therapist, which his spouse is reluctant about.     Interventions: Assertiveness/Communication, Solution-Oriented/Positive Psychology, and Insight-Oriented  Diagnosis:  Generalized anxiety  disorder  Psychiatric Treatment: No , N/A  Treatment Plan:  Client Abilities/Strengths Jared Simpson is open to sessions. presents as emotionally guarded but insightful regarding relational dynamics. Affect is constricted; mood appears anxious and tense. Speech is coherent and goal-directed. No acute safety concerns noted. Client demonstrates motivation to improve communication and emotional regulation.  Support System: Family and Friends   [employer] Officer of Needs Jared Simpson would like to focus on his feelings of guilt in putting himself first as he stars couples therapy to save his marriage.  New goal set   Treatment Level Biweekly  Symptoms  drained   (Status: declined) Mentally exhausted   (Status: declined)  Goals:   Jared Simpson contributing to avoidance and detachment. He exhibits decreased emotional safety and difficulty expressing vulnerability. Insight present; readiness for change moderate to high. Primary clinical needs: emotional regulation, boundary setting, and communication skills development. Follow up bi-weekly to monitor emotional safety, engagement, and progress toward readiness for couples work.   Target Date: 07/25/24 Frequency: Biweekly  Progress: 0 Modality: individual    Therapist will provide referrals for additional resources as appropriate.  Therapist will provide psycho-education regarding Jared Simpson's diagnosis and corresponding treatment approaches and interventions. Licensed Clinical Mental Health Counselor, Jared Simpson, [facility] will support the patient's ability to achieve the goals identified. will employ CBT, BA, Problem-solving, Solution Focused, Mindfulness,  coping skills, & other evidenced-based practices will be used to promote progress towards healthy functioning to help manage decrease symptoms associated with his diagnosis.   Reduce overall level, frequency, and intensity of the  feelings of depression, anxiety and panic evidenced by decreased mental exhaustion from 6 to 7 days/week to 0 to 1 days/week per client report for at least 3 consecutive  months. Verbally express understanding of the relationship between feelings of depression, anxiety and their impact on thinking patterns and behaviors. Verbalize an understanding of the role that distorted thinking plays in creating fears, excessive worry, and ruminations.  Jared Simpson participated in the creation of the treatment plan)   Jared Simpson, LCMHC

## 2024-05-30 ENCOUNTER — Encounter: Payer: Self-pay | Admitting: Licensed Clinical Social Worker

## 2024-06-02 DIAGNOSIS — F411 Generalized anxiety disorder: Secondary | ICD-10-CM | POA: Diagnosis not present

## 2024-06-12 ENCOUNTER — Ambulatory Visit: Admitting: Licensed Clinical Social Worker

## 2024-06-12 DIAGNOSIS — F411 Generalized anxiety disorder: Secondary | ICD-10-CM | POA: Diagnosis not present

## 2024-06-12 NOTE — Progress Notes (Signed)
 [facility] Counselor/Therapist Progress Note  Patient ID: Jared Simpson, MRN: [id number]    Date: 06/12/24  Time Spent: 9:03  am - 9:56 am : 53 Minutes  Treatment Type: Individual Therapy.  Reported Symptoms: Patient appeared calm, cooperative, and oriented. Affect congruent. Engaged in psychoeducation regarding mindfulness benefits. Demonstrated insight into personal stressors and willingness to adopt behavioral strategies for self-care.  Mental Status Exam: Appearance:  Well Groomed     Behavior: Appropriate and Sharing  Motor: Normal  Speech/Language:  Normal Rate  Affect: Appropriate  Mood: normal  Thought process: normal  Thought content:   WNL  Sensory/Perceptual disturbances:   WNL  Orientation: oriented to person, place, and time/date  Attention: Good  Concentration: Good  Memory: WNL  Fund of knowledge:  Good  Insight:   Good  Judgment:  Good  Impulse Control: Good   Risk Assessment: Danger to Self:  No Self-injurious Behavior: No Danger to Others: No Duty to Warn:no Physical Aggression / Violence:No  Access to Firearms a concern: No  Gang Involvement:No   Subjective:   Jared Simpson participated from home, via video, and consented to treatment. I discussed the limitations of evaluation and management by telemedicine and the availability of in person appointments. The patient expressed understanding and agreed to proceed.  Therapist participated from home office.  Jared Simpson reviewed the events of the past week. Patient reports progress toward goals of emotional safety and communication rebuilding. Since last session, he and his wife completed their first couples therapy appointment. Wife was initially unsure about having a male therapist but stated she liked him and they plan to return for a second session this week. Wife recently started a full-time job; due to schedule changes, they have not yet placed their youngest child in structured childcare but plan  to work on this before next session. Patient engaged in today's session and expressed motivation to continue improving emotional regulation and self-awareness. Infant child attended session with some distractions for patient.       Interventions: Mindfulness Meditation and Psycho-education/Bibliotherapy  Diagnosis:  GAD  Psychiatric Treatment: No , N/A  Treatment Plan:  Client Abilities/Strengths Jared Simpson is open to sessions.    Support System: Family and Friends   [employer] Officer of Needs Jared Simpson would like to  focus on his feelings of guilt in putting himself first as he stars couples therapy to save his marriage.     Treatment Level Biweekly  Symptoms  Emotional regulation disruption-stressed   (Status: maintained) productive   (Status: maintained)  Goals:   Jared Simpson Patient is making appropriate progress toward treatment goals. He is actively participating in couples therapy and individual work on emotional regulation. Increased stress related to family schedule changes acknowledged but manageable. Patient benefits from structured self-care practices and ongoing skill development in mindfulness. Continue supporting emotional safety, communication skills, and preparation for productive couples therapy. Implement micro-goal: walk 7,000-8,000 steps/day or 150 minutes/week for physical self-care. Continue mindfulness practice between sessions. Follow up in two weeks.   Target Date: 07/25/24 Frequency: Biweekly  Progress: 0 Modality: individual    Therapist will provide referrals for additional resources as appropriate.  Therapist will provide psycho-education regarding Jared Simpson's diagnosis and corresponding treatment approaches and interventions. Licensed Clinical Mental Health Counselor, Jared Simpson, Jared Simpson will support the patient's ability to achieve the goals identified. will employ CBT, BA, Problem-solving, Solution Focused,  Mindfulness,  coping skills, & other evidenced-based practices will be used to promote progress towards healthy functioning to  help manage decrease symptoms associated with his diagnosis.   Reduce overall level, frequency, and intensity of the feelings of depression, anxiety and panic evidenced by decreased emotional dysregulation from 6 to 7 days/week to 0 to 1 days/week per client report for at least 3 consecutive months. Verbally express understanding of the relationship between feelings of depression, anxiety and their impact on thinking patterns and behaviors. Verbalize an understanding of the role that distorted thinking plays in creating fears, excessive worry, and ruminations.  Jared Simpson participated in the creation of the treatment plan)   Jared Simpson, LCMHC     Jared Simpson, LCMHC

## 2024-06-26 ENCOUNTER — Ambulatory Visit: Admitting: Licensed Clinical Social Worker

## 2024-06-26 DIAGNOSIS — F411 Generalized anxiety disorder: Secondary | ICD-10-CM | POA: Diagnosis not present

## 2024-06-26 NOTE — Progress Notes (Signed)
 [facility] Counselor/Therapist Progress Note  Patient ID: Jared Simpson, MRN: [id number]    Date: 06/26/24  Time Spent: 9:01  am - 9:57 am : 56 Minutes  Treatment Type: Individual Therapy.  Reported Symptoms: Patient appeared anxious, fatigued, and tense throughout the session. Affect constricted but appropriate. Speech occasionally pressured; tone conveyed fear and emotional strain. Insight good; motivation high. No evidence of intoxication or acute safety concerns.  Mental Status Exam: Appearance:  Well Groomed     Behavior: Appropriate  Motor: Normal  Speech/Language:  Normal Rate  Affect: Appropriate  Mood: sad  Thought process: blocked  Thought content:   WNL  Sensory/Perceptual disturbances:   WNL  Orientation: oriented to person, place, and time/date  Attention: Good  Concentration: Good  Memory: WNL  Fund of knowledge:  Good  Insight:   Good  Judgment:  Good  Impulse Control: Good   Risk Assessment: Danger to Self:  No Self-injurious Behavior: No Danger to Others: No Duty to Warn:no Physical Aggression / Violence:No  Access to Firearms a concern: No  Gang Involvement:No   Subjective:   Jared Simpson participated from home, via video, and consented to treatment. I discussed the limitations of evaluation and management by telemedicine and the availability of in person appointments. The patient expressed understanding and agreed to proceed.  Therapist participated from home office.  Aadan reviewed the events of the past week. Patient reports increased anger and resentment toward his wife since she began working full time and attending school (projected completion summer 2026). He states communication is poor, perceives his wife as uncompromising, and feels she expects him to carry most household and parenting duties. He reports feeling "drowning" and fears a potential nervous breakdown if circumstances do not improve. Patient describes persistent anxiety and  exhaustion. He expresses frustration that insights gained in individual therapy are difficult to implement within the relationship. Patient is cautiously hopeful after switching to a male couples therapist; they have completed three sessions and he is optimistic this may improve communication, coparenting, and relational repair. He plans to communicate his intentions and needs clearly in the couples session tomorrow and wants to gently inform his wife in advance to avoid blindsiding her. Reviewed history of substance abuse and reinforced coping strategies to prevent relapse during this high-stress period. Patient agrees that today's summary accurately reflects his concerns and goals.     Interventions: Solution-Oriented/Positive Psychology  Diagnosis:  GAD  Psychiatric Treatment: No , N/A  Treatment Plan:  Client Abilities/Strengths Jared Simpson is open to sessions.    Support System: Family and Friends  [employer] Officer of Needs Jared Simpson would like to  focus on his feelings of guilt in putting himself first as he starts couples therapy to save his marriage.      Treatment Level Biweekly  Symptoms  overwhelmed   (Status: declined) resentful   (Status: declined)  Goals:   Jared Simpson Adjustment-related anxiety and relational stress related to workload imbalance, communication difficulties, and unmet emotional needs within the marriage. Heightened emotional exhaustion; relapse risk acknowledged but currently mitigated through coping strategies. Prepared patient for discussing needs and intentions in upcoming couples therapy session. Reinforced consistent use of coping skills to maintain sobriety and manage anxiety. Encouraged clear, non-accusatory communication and realistic expectations for relational change. Patient will provide updates after couples therapy and integrate new strategies to reduce resentment and improve communication. Follow-up scheduled in  two weeks. Target Date: 07/25/24 Frequency: Biweekly  Progress: 0 Modality: individual  Therapist will provide referrals for additional resources as appropriate.  Therapist will provide psycho-education regarding Jared Simpson's diagnosis and corresponding treatment approaches and interventions. Licensed Clinical Mental Health Counselor, Tawni Louder, [facility] will support the patient's ability to achieve the goals identified. will employ CBT, BA, Problem-solving, Solution Focused, Mindfulness,  coping skills, & other evidenced-based practices will be used to promote progress towards healthy functioning to help manage decrease symptoms associated with her diagnosis.   Reduce overall level, frequency, and intensity of the feelings of depression, anxiety and panic evidenced by decreased resentment and feelings of being overwhelmed from 6 to 7 days/week to 0 to 1 days/week per client report for at least 3 consecutive months. Verbally express understanding of the relationship between feelings of depression, anxiety and their impact on thinking patterns and behaviors. Verbalize an understanding of the role that distorted thinking plays in creating fears, excessive worry, and ruminations.  Jared Simpson participated in the creation of the treatment plan)   Jared Simpson, LCMHC

## 2024-07-10 ENCOUNTER — Ambulatory Visit: Admitting: Licensed Clinical Social Worker

## 2024-07-17 ENCOUNTER — Ambulatory Visit: Admitting: Licensed Clinical Social Worker

## 2024-07-17 DIAGNOSIS — F411 Generalized anxiety disorder: Secondary | ICD-10-CM | POA: Diagnosis not present

## 2024-07-17 NOTE — Progress Notes (Signed)
 "  [facility] Counselor/Therapist Progress Note  Patient ID: Jared Simpson, MRN: [id number]    Date: 07/17/2024  Time Spent: 9:01  am - 9:54 am : 53 Minutes  Treatment Type: Individual Therapy.  Reported Symptoms: Patient was engaged, reflective, and able to articulate emotional triggers and past behavioral patterns. Affect appropriate, thought process logical, and insight improving. No safety concerns noted.  Mental Status Exam: Appearance:  Casual     Behavior: Appropriate  Motor: Normal  Speech/Language:  Normal Rate  Affect: Appropriate  Mood: normal  Thought process: normal  Thought content:   WNL  Sensory/Perceptual disturbances:   WNL  Orientation: oriented to person, place, and time/date  Attention: Good  Concentration: Good  Memory: WNL  Fund of knowledge:  Good  Insight:   Good  Judgment:  Good  Impulse Control: Good   Risk Assessment: Danger to Self:  No Self-injurious Behavior: No Danger to Others: No Duty to Warn:no Physical Aggression / Violence:No  Access to Firearms a concern: No  Gang Involvement:No   Subjective:   Jared Simpson participated from home, via video, and consented to treatment. I discussed the limitations of evaluation and management by telemedicine and the availability of in person appointments. The patient expressed understanding and agreed to proceed.  Therapist participated from home office.  Jared Simpson reviewed the events of the past week. Patient reported missing last session due to a work meeting. States he and his wife continue to make progress in marital counseling. He recognizes that his wifes emotional state, tone, and delivery can trigger him, linking this reaction to a younger, rebellious part of himself that struggles with being told what to do. Patient expressed insight into these patterns and openness to deeper reflection.     Interventions: Mindfulness Meditation and Insight-Oriented  Diagnosis:  Generalized anxiety  disorder  Psychiatric Treatment: No , N/A  Treatment Plan:  Client Abilities/Strengths Jared Simpson is open to sessions.    Support System: Family and Friends  [employer] Officer of Needs Jared Simpson would like to focus on his feelings of guilt in putting himself first as he starts couples therapy to save his marriage.         Treatment Level Biweekly  Symptoms  Reactive/triggered   (Status: maintained) Emotionally dis regulated    (Status: maintained)  Goals:   Jared Simpson Progress noted toward goals of emotional safety, improved communication, and preparation for productive couples therapy. Increased self-awareness of emotion mind states and personal triggers. Continued need to strengthen emotional regulation and vulnerability. Session centered on exploring states of mind with emphasis on emotion mind, strengthening emotional safety, and increasing personal accountability. Patient was given reflective prompts to deepen insight: What makes me feel emotionally safe? and What actions taken in emotion mind have I later regretted? Therapy will resume in three weeks due to holidays and scheduling availability.   Target Date: 07/25/24 Frequency: Biweekly  Progress: 0 Modality: individual    Therapist will provide referrals for additional resources as appropriate.  Therapist will provide psycho-education regarding Hy's diagnosis and corresponding treatment approaches and interventions. Licensed Clinical Mental Health Counselor, Tawni Louder, [facility] will support the patient's ability to achieve the goals identified. will employ CBT, BA, Problem-solving, Solution Focused, Mindfulness,  coping skills, & other evidenced-based practices will be used to promote progress towards healthy functioning to help manage decrease symptoms associated with her diagnosis.   Reduce overall level, frequency, and intensity of the feelings of depression, anxiety and panic evidenced by  decreased  resentment and feelings of being overwhelmed  from 6 to 7 days/week to 0 to 1 days/week per client report for at least 3 consecutive months. Verbally express understanding of the relationship between feelings of depression, anxiety and their impact on thinking patterns and behaviors. Verbalize an understanding of the role that distorted thinking plays in creating fears, excessive worry, and ruminations.  Jared Simpson participated in the creation of the treatment plan)   Andreu Drudge, [facility]    "

## 2024-08-07 ENCOUNTER — Ambulatory Visit: Admitting: Licensed Clinical Social Worker

## 2024-08-07 ENCOUNTER — Encounter: Payer: Self-pay | Admitting: Licensed Clinical Social Worker

## 2024-08-07 DIAGNOSIS — F411 Generalized anxiety disorder: Secondary | ICD-10-CM | POA: Diagnosis not present

## 2024-08-07 NOTE — Progress Notes (Signed)
 [facility] Counselor/Therapist Progress Note  Patient ID: Jared Simpson, MRN: [id number]    Date: 08/07/2024  Time Spent: 9:01  am - 9:56 am : 55 Minutes  Treatment Type: Individual Therapy.  Reported Symptoms: Patient engaged, reflective, and able to articulate patterns and emotional responses. Demonstrates insight into family dynamics and personal boundaries. Affect congruent; no acute distress noted. Actively participated in psychoeducation discussion and goal review.  Mental Status Exam: Appearance:  Well Groomed     Behavior: Appropriate and Sharing  Motor: Normal  Speech/Language:  Normal Rate  Affect: Appropriate  Mood: normal  Thought process: circumstantial  Thought content:   WNL  Sensory/Perceptual disturbances:   WNL  Orientation: oriented to person, place, and time/date  Attention: Good  Concentration: Good  Memory: WNL  Fund of knowledge:  Good  Insight:   Good  Judgment:  Good  Impulse Control: Good   Risk Assessment: Danger to Self:  No Self-injurious Behavior: No Danger to Others: No Duty to Warn:no Physical Aggression / Violence:No  Access to Firearms a concern: No  Gang Involvement:No   Subjective:   [name] participated from home, via video, and consented to treatment. I discussed the limitations of evaluation and management by telemedicine and the availability of in person appointments. The patient expressed understanding and agreed to proceed.  Therapist participated from home office.  [name] reviewed the events of the past week. Patient reports progress toward goal of emotional safety, rebuilding communication, and preparation for productive couples therapy, noting improved emotional regulation and self-awareness. He describes recent family stressor: mother sent a guilt-inducing message to siblings. One sibling is no-contact, another defends mother and offers conditional connection. Patient and one sibling recognize mothers unhealthy  relational patterns and feel torn between desire for connection and exhaustion with lack of reciprocity. Patient reports feeling unable to control the situation and considering further limiting or redefining contact. Ongoing emotional impact from prior retaliatory behavior by mother around daughters birthday.     Interventions: Solution-Oriented/Positive Psychology and Psycho-education/Bibliotherapy  Diagnosis:  Generalized anxiety disorder  Psychiatric Treatment: No , N/A  Treatment Plan:  Client Abilities/Strengths [name] is open to sessions.  Support System: Family and Friends   [employer] Officer of Needs Emmet The patient requires ongoing therapy to support emotional regulation, boundary setting, and self-confidence, particularly in interpersonal and family dynamics. Treatment will focus on improving emotional safety, communication, and in-the-moment coping skills while preparing for future couples therapy.   Treatment Level Biweekly  Symptoms  Difficulty maintaining emotional safety and regulation during family and interpersonal interactions   (Status: maintained) Insecurity and reduced confidence when speaking or expressing needs in front of others   (Status: maintained)  Goals:   [name] Patient has met current goal with observable gains in emotional regulation, insight, and boundary awareness. Family-of-origin stress continues to challenge emotional safety. Ambivalence noted around contact vs. no contact, with increased clarity emerging. Emotional insecurity and anxiety related to speaking in front of others identified as potential new treatment focus. Current treatment goal related to emotional safety and communication groundwork has been closed due to patient progress. Patient will continue to process independently and reflect on an appropriate level of contact with his mother. Ongoing psychoeducation will focus on boundaries and emotional  safety. A new treatment goal will target insecurity, confidence, and emotional safety when speaking in front of others, with mindfulness-based techniques implemented to support in-the-moment emotional regulation. Progress and new goal will be reassessed in future sessions.  Target Date: 10/12/24 Frequency: Biweekly  Progress: 0 Modality: individual    Therapist will provide referrals for additional resources as appropriate.  Therapist will provide psycho-education regarding [name]'s diagnosis and corresponding treatment approaches and interventions. Licensed Clinical Mental Health Counselor, Tawni Louder, [facility] will support the patient's ability to achieve the goals identified. will employ CBT, BA, Problem-solving, Solution Focused, Mindfulness,  coping skills, & other evidenced-based practices will be used to promote progress towards healthy functioning to help manage decrease symptoms associated with his diagnosis.   Reduce overall level, frequency, and intensity of the feelings of depression, anxiety and panic evidenced by decreased  resentment and feelings of being overwhelmed  from 6 to 7 days/week to 0 to 1 days/week per client report for at least 3 consecutive months. Verbally express understanding of the relationship between feelings of depression, anxiety and their impact on thinking patterns and behaviors. Verbalize an understanding of the role that distorted thinking plays in creating fears, excessive worry, and ruminations.  [name] participated in the creation of the treatment plan)   [name], LCMHC

## 2024-08-08 ENCOUNTER — Other Ambulatory Visit: Payer: Self-pay | Admitting: Medical Genetics

## 2024-08-14 ENCOUNTER — Ambulatory Visit: Payer: Self-pay | Admitting: Family Medicine

## 2024-08-14 ENCOUNTER — Ambulatory Visit: Payer: BC Managed Care – PPO | Admitting: Family Medicine

## 2024-08-14 ENCOUNTER — Encounter: Payer: Self-pay | Admitting: Family Medicine

## 2024-08-14 VITALS — BP 122/78 | HR 100 | Temp 97.8°F | Resp 16 | Ht 67.0 in | Wt 186.4 lb

## 2024-08-14 DIAGNOSIS — Z23 Encounter for immunization: Secondary | ICD-10-CM

## 2024-08-14 DIAGNOSIS — Z0000 Encounter for general adult medical examination without abnormal findings: Secondary | ICD-10-CM | POA: Diagnosis not present

## 2024-08-14 LAB — LIPID PANEL
Cholesterol: 164 mg/dL (ref 28–200)
HDL: 30.5 mg/dL — ABNORMAL LOW
LDL Cholesterol: 99 mg/dL (ref 10–99)
NonHDL: 133.44
Total CHOL/HDL Ratio: 5
Triglycerides: 172 mg/dL — ABNORMAL HIGH (ref 10.0–149.0)
VLDL: 34.4 mg/dL (ref 0.0–40.0)

## 2024-08-14 LAB — COMPREHENSIVE METABOLIC PANEL WITH GFR
ALT: 18 U/L (ref 3–53)
AST: 18 U/L (ref 5–37)
Albumin: 4.6 g/dL (ref 3.5–5.2)
Alkaline Phosphatase: 67 U/L (ref 39–117)
BUN: 9 mg/dL (ref 6–23)
CO2: 29 meq/L (ref 19–32)
Calcium: 9.7 mg/dL (ref 8.4–10.5)
Chloride: 101 meq/L (ref 96–112)
Creatinine, Ser: 0.84 mg/dL (ref 0.40–1.50)
GFR: 110.1 mL/min
Glucose, Bld: 87 mg/dL (ref 70–99)
Potassium: 4.7 meq/L (ref 3.5–5.1)
Sodium: 139 meq/L (ref 135–145)
Total Bilirubin: 0.5 mg/dL (ref 0.2–1.2)
Total Protein: 7.5 g/dL (ref 6.0–8.3)

## 2024-08-14 LAB — CBC
HCT: 41.5 % (ref 39.0–52.0)
Hemoglobin: 14.4 g/dL (ref 13.0–17.0)
MCHC: 34.8 g/dL (ref 30.0–36.0)
MCV: 89.3 fl (ref 78.0–100.0)
Platelets: 321 K/uL (ref 150.0–400.0)
RBC: 4.64 Mil/uL (ref 4.22–5.81)
RDW: 13.2 % (ref 11.5–15.5)
WBC: 6.2 K/uL (ref 4.0–10.5)

## 2024-08-14 MED ORDER — ROSUVASTATIN CALCIUM 20 MG PO TABS: 20.0000 mg | ORAL_TABLET | Freq: Every day | ORAL | 3 refills | Status: AC

## 2024-08-14 NOTE — Progress Notes (Signed)
 Chief Complaint  Patient presents with   Annual Exam    CPE    Well Male Jared Simpson is here for a complete physical.   His last physical was >1 year ago.  Current diet: in general, a healthy diet.   Current exercise: active with kids Weight trend: intentionally  Fatigue out of ordinary? No. Seat belt? Yes.   Advanced directive? No  Health maintenance Tetanus- Yes HIV- Yes Hep C- Yes  Past Medical History:  Diagnosis Date   GERD (gastroesophageal reflux disease)      Past Surgical History:  Procedure Laterality Date   gum graft  1997   WISDOM TOOTH EXTRACTION Bilateral 2006    Medications  Medications Ordered Prior to Encounter[1]   Allergies Allergies[2]  Family History Family History  Problem Relation Age of Onset   Non-Hodgkin's lymphoma Brother    Cancer Maternal Grandfather        skin cancer   Heart disease Paternal Grandmother    Diabetes Paternal Grandfather    Colon cancer Neg Hx    Esophageal cancer Neg Hx    Rectal cancer Neg Hx    Stomach cancer Neg Hx     Review of Systems: Constitutional: no fevers or chills Eye:  no recent significant change in vision Ear/Nose/Mouth/Throat:  Ears:  no hearing loss Nose/Mouth/Throat:  no complaints of nasal congestion, no sore throat Cardiovascular:  no chest pain Respiratory:  no shortness of breath Gastrointestinal:  no abdominal pain, no change in bowel habits GU:  Male: negative for dysuria Musculoskeletal/Extremities:  no pain of the joints Integumentary (Skin/Breast):  no abnormal skin lesions reported Neurologic:  no headaches Endocrine: No unexpected weight changes Hematologic/Lymphatic:  no night sweats  Exam BP 122/78 (BP Location: Left Arm, Patient Position: Sitting)   Pulse 100   Temp 97.8 F (36.6 C) (Oral)   Resp 16   Ht 5' 7 (1.702 m)   Wt 186 lb 6.4 oz (84.6 kg)   SpO2 100%   BMI 29.19 kg/m  General:  well developed, well nourished, in no apparent distress Skin:  no  significant moles, warts, or growths Head:  no masses, lesions, or tenderness Eyes:  pupils equal and round, sclera anicteric without injection Ears:  canals without lesions, TMs shiny without retraction, no obvious effusion, no erythema Nose:  nares patent, mucosa normal Throat/Pharynx:  lips and gingiva without lesion; tongue and uvula midline; non-inflamed pharynx; no exudates or postnasal drainage Neck: neck supple without adenopathy, thyromegaly, or masses Lungs:  clear to auscultation, breath sounds equal bilaterally, no respiratory distress Cardio:  regular rate and rhythm, no bruits, no LE edema Abdomen:  abdomen soft, nontender; bowel sounds normal; no masses or organomegaly Genital (male): Deferred Rectal: Deferred Musculoskeletal:  symmetrical muscle groups noted without atrophy or deformity Extremities:  no clubbing, cyanosis, or edema, no deformities, no skin discoloration Neuro:  gait normal; deep tendon reflexes normal and symmetric Psych: well oriented with normal range of affect and appropriate judgment/insight  Assessment and Plan  Well adult exam - Plan: CBC, Comprehensive metabolic panel with GFR, Lipid panel   Well 39 y.o. male. Counseled on diet and exercise. Self testicular exams recommended at least monthly.  Advanced directive form provided today.  Flu shot today. Other orders as above. Follow up in 6 mo pending the above workup. The patient voiced understanding and agreement to the plan.  Mabel Mt Eldred, DO 08/14/24 7:24 AM     [1]  Current Outpatient Medications on File Prior  to Visit  Medication Sig Dispense Refill   rosuvastatin  (CRESTOR ) 20 MG tablet Take 1 tablet (20 mg total) by mouth daily. 90 tablet 3   No current facility-administered medications on file prior to visit.  [2]  Allergies Allergen Reactions   Penicillins     Was told he is allergic but does not know what it does.

## 2024-08-14 NOTE — Patient Instructions (Signed)
 Give Jared Simpson 2-3 business days to get the results of your labs back.   Keep the diet clean and stay active.  Do monthly self testicular checks in the shower. You are feeling for lumps/bumps that don't belong. If you feel anything like this, let me know!  Please get me a copy of your advanced directive form at your convenience.   Let Jared Simpson know if you need anything.

## 2024-08-14 NOTE — Addendum Note (Signed)
 Addended by: Ozelle Brubacher M on: 08/14/2024 07:32 AM   Modules accepted: Orders

## 2024-08-21 ENCOUNTER — Ambulatory Visit (INDEPENDENT_AMBULATORY_CARE_PROVIDER_SITE_OTHER): Admitting: Licensed Clinical Social Worker

## 2024-08-21 DIAGNOSIS — F411 Generalized anxiety disorder: Secondary | ICD-10-CM

## 2024-08-21 NOTE — Progress Notes (Signed)
 " [facility] Counselor/Therapist Progress Note  Patient ID: Jared Simpson, MRN: [id number]    Date: 08/21/24  Time Spent: 9:02  am - 9:55 am : 53 Minutes  Treatment Type: Individual Therapy.  Reported Symptoms: Patient thoughtful and emotionally regulated. Affect guarded but appropriate. Demonstrates insight into family dynamics, boundaries, and personal limits. No acute distress noted.  Mental Status Exam: Appearance:  Well Groomed     Behavior: Appropriate and Rationalizing  Motor: Normal  Speech/Language:  Normal Rate  Affect: Appropriate  Mood: normal  Thought process: normal  Thought content:   WNL  Sensory/Perceptual disturbances:   WNL  Orientation: oriented to person, place, and time/date  Attention: Good  Concentration: Good  Memory: WNL  Fund of knowledge:  Good  Insight:   Good  Judgment:  Good  Impulse Control: Good   Risk Assessment: Danger to Self:  No Self-injurious Behavior: No Danger to Others: No Duty to Warn:no Physical Aggression / Violence:No  Access to Firearms a concern: No  Gang Involvement:No   Subjective:   Jared Simpson participated from home, via video, and consented to treatment. I discussed the limitations of evaluation and management by telemedicine and the availability of in person appointments. The patient expressed understanding and agreed to proceed.  Therapist participated from home office.  Vester reviewed the events of the past week. Patient reports recent outreach from estranged mother, which he perceives as manipulative and consistent with her past tantrum-like behaviors. States he has not responded and feels conflicted, noting two brothers have reengaged with their mother while he remains unsure and unconvinced. Expresses concern about her bipolar disorder, lack of engagement in therapy despite medication, and limited optimism for sustained change.    Interventions: Assertiveness/Communication and  Insight-Oriented  Diagnosis:  Generalized anxiety disorder  Psychiatric Treatment: No , N/A  Treatment Plan:  Client Abilities/Strengths Jared Simpson is open to sessions.    Support System: Family and Friends   [employer] Officer of Needs Jared Simpson would like to The patient requires ongoing therapy to support emotional regulation, boundary setting, and self-confidence, particularly in interpersonal and family dynamics. Treatment will focus on improving emotional safety, communication, and in-the-moment coping skills while navigating future couples therapy.    Treatment Level Biweekly  Symptoms  Ongoing emotional stress and ambivalence related to estranged maternal relationship   (Status: maintained) Distrust and hypervigilance in response to perceived manipulative behaviors   (Status: maintained)  Goals:   Jared Simpson Ongoing relational stress related to estranged maternal relationship. Patient demonstrates healthy skepticism, strong insight, and emerging clarity around boundaries and no-contact options. Ambivalence present regarding potential reengagement given history of manipulation and limited evidence of maternal growth. Reviewed levels of no contact and boundary-setting strategies. Assigned homework to process offline what safeguards and conditions would be required for any potential reengagement and whether he can realistically commit to allowing time for change. Discussed exploring workplace EAP benefits for possible family sessions. Continue support around boundary maintenance and decision-making next session.   Target Date: 10/12/24 Frequency: Biweekly  Progress: 0 Modality: individual    Therapist will provide referrals for additional resources as appropriate.  Therapist will provide psycho-education regarding Jared Simpson's diagnosis and corresponding treatment approaches and interventions. Licensed Clinical Mental Health Counselor, Jared Simpson, [facility]  will support the patient's ability to achieve the goals identified. will employ CBT, BA, Problem-solving, Solution Focused, Mindfulness,  coping skills, & other evidenced-based practices will be used to promote progress towards healthy functioning to help manage decrease symptoms associated  with his diagnosis.   Reduce overall level, frequency, and intensity of the feelings of depression, anxiety and panic evidenced by decreased lack of trust in mother from 6 to 7 days/week to 0 to 1 days/week per client report for at least 3 consecutive months. Verbally express understanding of the relationship between feelings of depression, anxiety and their impact on thinking patterns and behaviors. Verbalize an understanding of the role that distorted thinking plays in creating fears, excessive worry, and ruminations.  Jared Simpson participated in the creation of the treatment plan)   Jared Simpson, [facility]    "

## 2024-09-04 ENCOUNTER — Ambulatory Visit: Admitting: Licensed Clinical Social Worker

## 2025-08-15 ENCOUNTER — Encounter: Admitting: Family Medicine
